# Patient Record
Sex: Female | Born: 1960 | Race: Black or African American | Hispanic: No | Marital: Single | Smoking: Never smoker
Health system: Southern US, Community
[De-identification: ages and names within clinical notes are randomized; demographics above are authoritative.]

## PROBLEM LIST (undated history)

## (undated) DIAGNOSIS — R7301 Impaired fasting glucose: Secondary | ICD-10-CM

## (undated) DIAGNOSIS — I471 Supraventricular tachycardia, unspecified: Secondary | ICD-10-CM

## (undated) DIAGNOSIS — R001 Bradycardia, unspecified: Secondary | ICD-10-CM

## (undated) DIAGNOSIS — E039 Hypothyroidism, unspecified: Secondary | ICD-10-CM

## (undated) DIAGNOSIS — E669 Obesity, unspecified: Secondary | ICD-10-CM

## (undated) DIAGNOSIS — I10 Essential (primary) hypertension: Secondary | ICD-10-CM

## (undated) DIAGNOSIS — E559 Vitamin D deficiency, unspecified: Secondary | ICD-10-CM

## (undated) DIAGNOSIS — I4719 Other supraventricular tachycardia: Secondary | ICD-10-CM

## (undated) DIAGNOSIS — R011 Cardiac murmur, unspecified: Secondary | ICD-10-CM

## (undated) DIAGNOSIS — M79605 Pain in left leg: Secondary | ICD-10-CM

## (undated) DIAGNOSIS — R55 Syncope and collapse: Secondary | ICD-10-CM

## (undated) DIAGNOSIS — R0600 Dyspnea, unspecified: Secondary | ICD-10-CM

## (undated) DIAGNOSIS — M79604 Pain in right leg: Secondary | ICD-10-CM

## (undated) HISTORY — DX: Essential (primary) hypertension: I10

## (undated) HISTORY — DX: Pain in right leg: M79.604

## (undated) HISTORY — DX: Vitamin D deficiency, unspecified: E55.9

## (undated) HISTORY — DX: Cardiac murmur, unspecified: R01.1

## (undated) HISTORY — DX: Obesity, unspecified: E66.9

## (undated) HISTORY — DX: Dyspnea, unspecified: R06.00

## (undated) HISTORY — DX: Supraventricular tachycardia: I47.1

## (undated) HISTORY — DX: Impaired fasting glucose: R73.01

## (undated) HISTORY — DX: Bradycardia, unspecified: R00.1

## (undated) HISTORY — DX: Other supraventricular tachycardia: I47.19

## (undated) HISTORY — DX: Supraventricular tachycardia, unspecified: I47.10

## (undated) HISTORY — DX: Syncope and collapse: R55

## (undated) HISTORY — DX: Hypothyroidism, unspecified: E03.9

## (undated) HISTORY — DX: Pain in right leg: M79.605

---

## 2004-03-01 ENCOUNTER — Ambulatory Visit (HOSPITAL_COMMUNITY): Admission: RE | Admit: 2004-03-01 | Discharge: 2004-03-01 | Payer: Self-pay | Admitting: Endocrinology

## 2005-04-15 ENCOUNTER — Ambulatory Visit: Admission: RE | Admit: 2005-04-15 | Discharge: 2005-04-15 | Payer: Self-pay | Admitting: Endocrinology

## 2007-05-31 ENCOUNTER — Encounter: Admission: RE | Admit: 2007-05-31 | Discharge: 2007-05-31 | Payer: Self-pay | Admitting: Endocrinology

## 2008-05-10 ENCOUNTER — Encounter: Admission: RE | Admit: 2008-05-10 | Discharge: 2008-05-10 | Payer: Self-pay | Admitting: Endocrinology

## 2008-11-08 ENCOUNTER — Encounter: Admission: RE | Admit: 2008-11-08 | Discharge: 2008-11-08 | Payer: Self-pay | Admitting: Endocrinology

## 2008-11-29 ENCOUNTER — Encounter (INDEPENDENT_AMBULATORY_CARE_PROVIDER_SITE_OTHER): Payer: Self-pay | Admitting: Interventional Radiology

## 2008-11-29 ENCOUNTER — Other Ambulatory Visit: Admission: RE | Admit: 2008-11-29 | Discharge: 2008-11-29 | Payer: Self-pay | Admitting: Interventional Radiology

## 2008-11-29 ENCOUNTER — Encounter: Admission: RE | Admit: 2008-11-29 | Discharge: 2008-11-29 | Payer: Self-pay | Admitting: Endocrinology

## 2010-03-04 ENCOUNTER — Encounter
Admission: RE | Admit: 2010-03-04 | Discharge: 2010-03-04 | Payer: Self-pay | Source: Home / Self Care | Attending: Endocrinology | Admitting: Endocrinology

## 2010-08-14 IMAGING — US US SOFT TISSUE HEAD/NECK
1 series · 14 of 25 positions shown · non-contrast
Comparison: Ultrasound of the thyroid of 05/10/2008

CLINICAL DATA: Follow up of thyroid nodule, the patient is on
Synthroid

THYROID ULTRASOUND
TECHNIQUE: Ultrasound examination of the thyroid gland and
adjacent soft tissues was performed.

[Series 1: us soft tissue head/neck · 0.09mm/px · 14 of 42 slices shown]
[im 1/42]
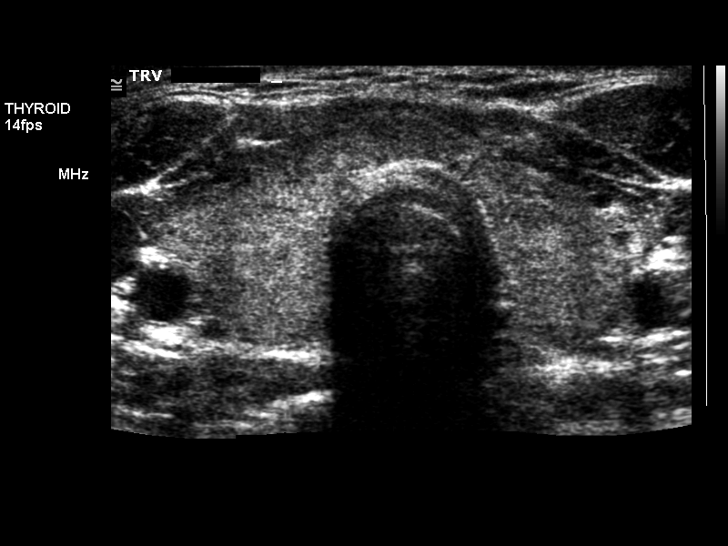
[im 4/42]
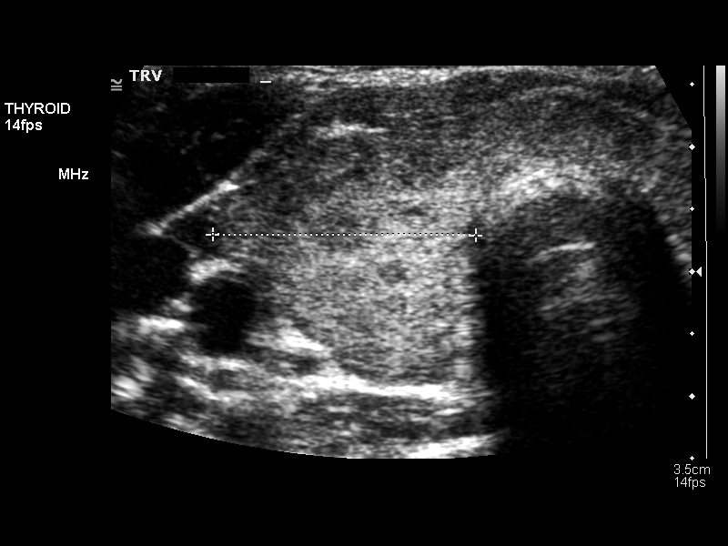
[im 7/42]
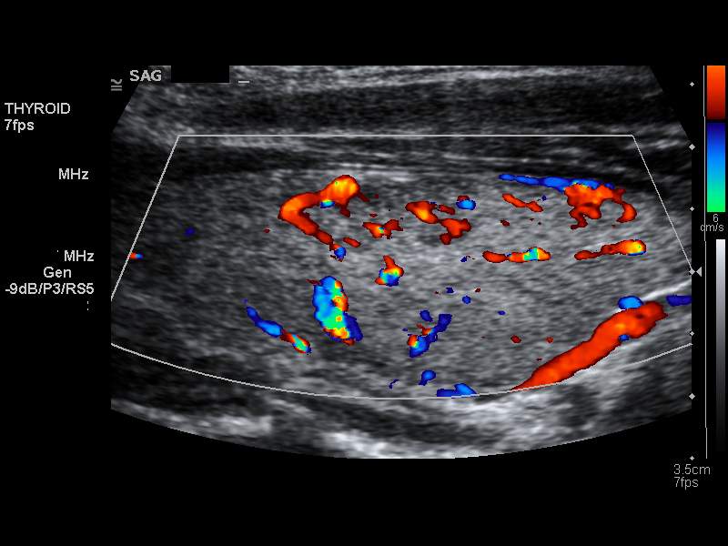
[im 11/42]
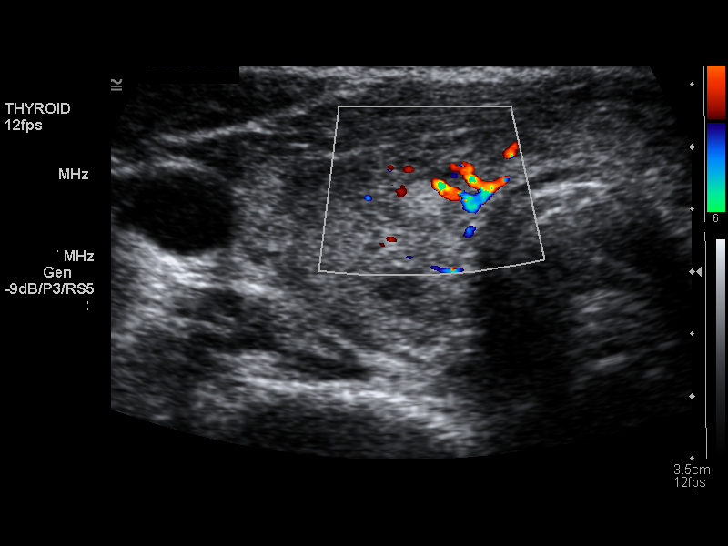
[im 14/42]
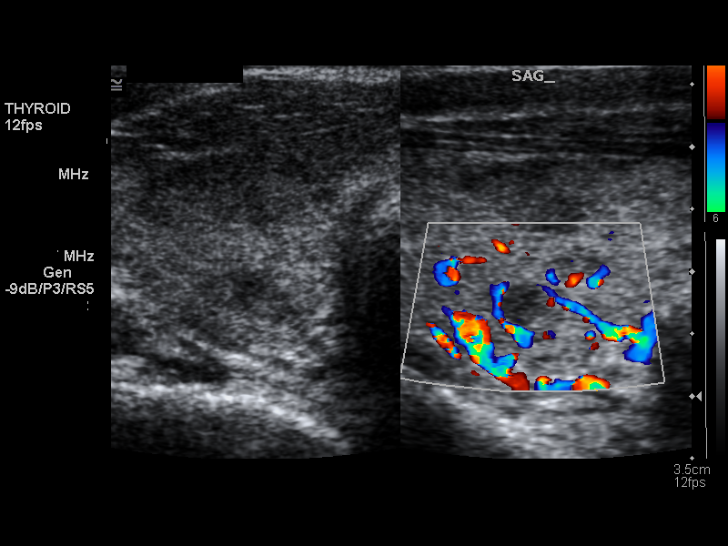
[im 16/42]
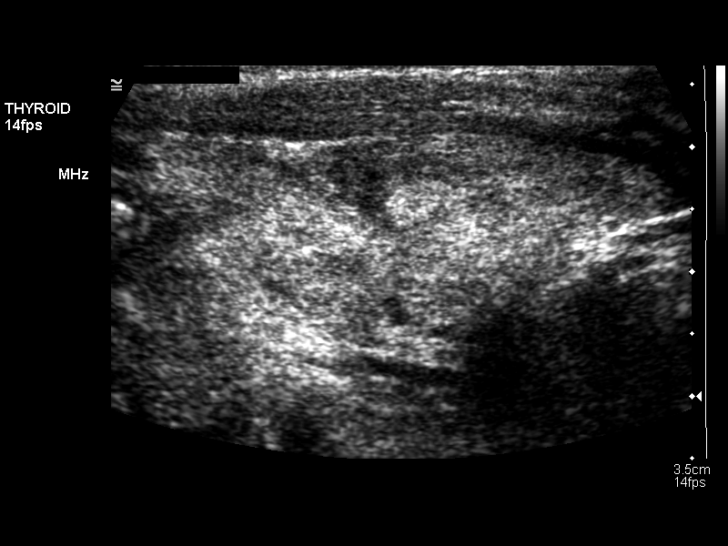
[im 19/42]
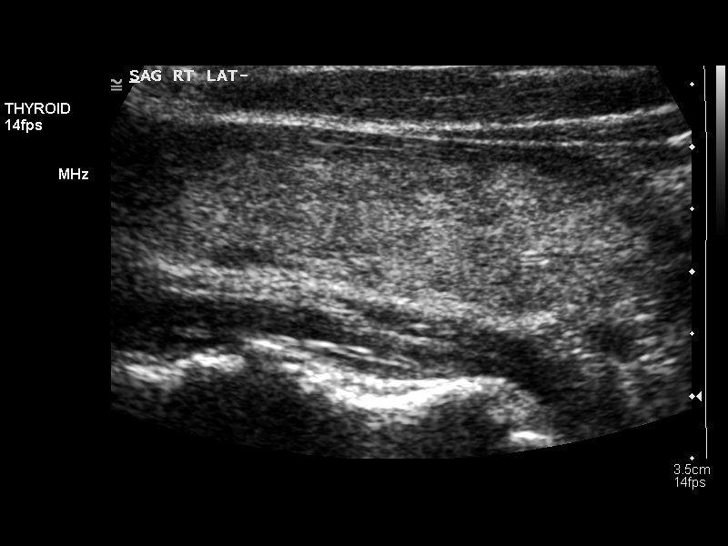
[im 23/42]
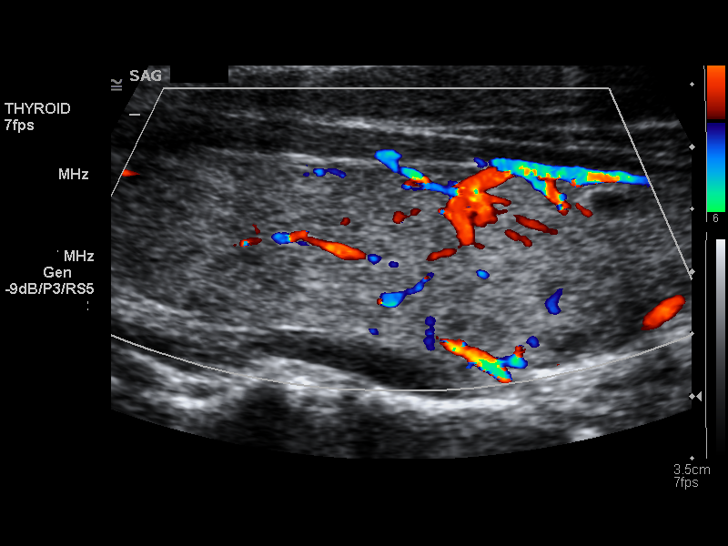
[im 26/42]
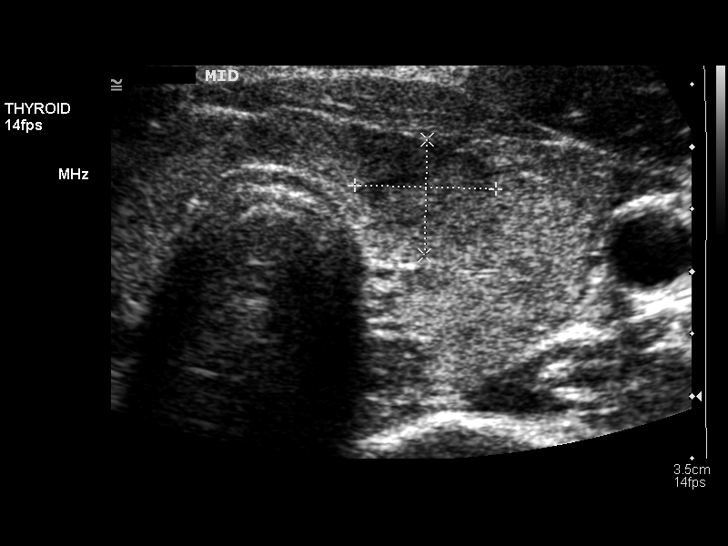
[im 28/42]
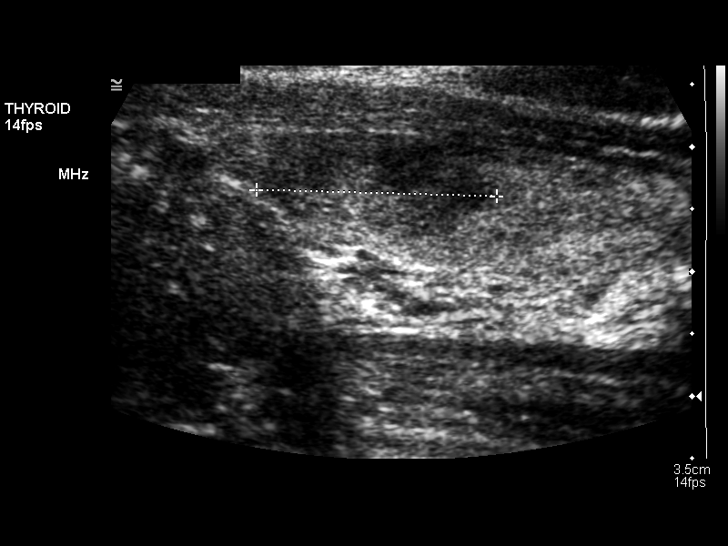
[im 31/42]
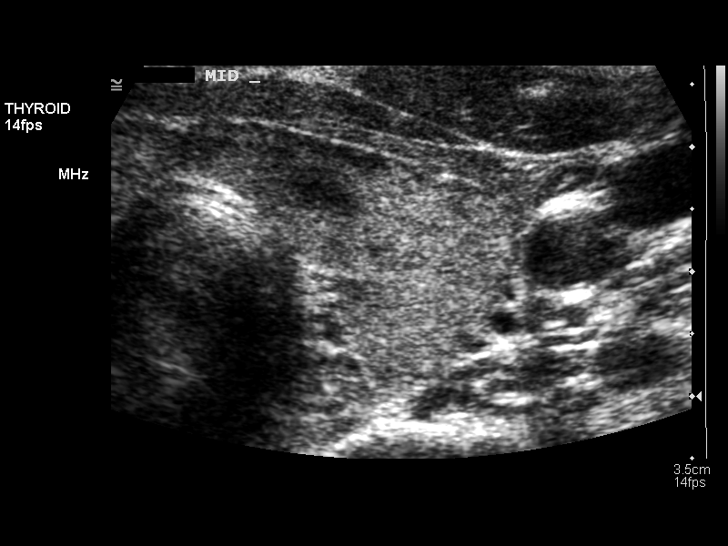
[im 35/42]
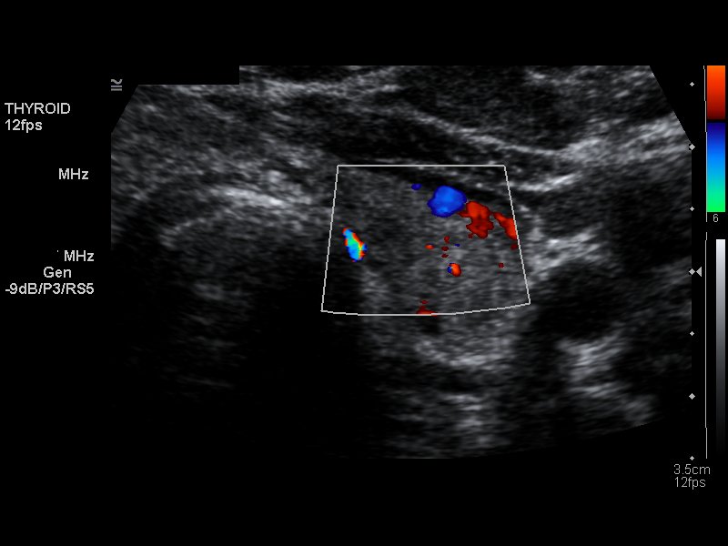
[im 38/42]
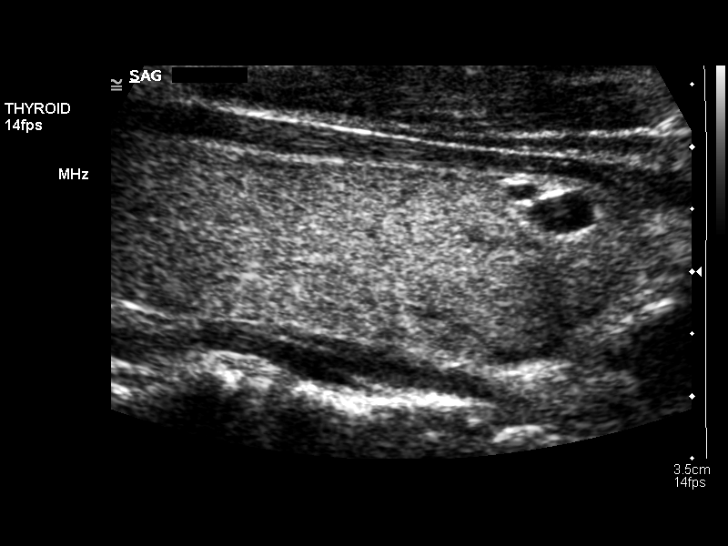
[im 42/42]
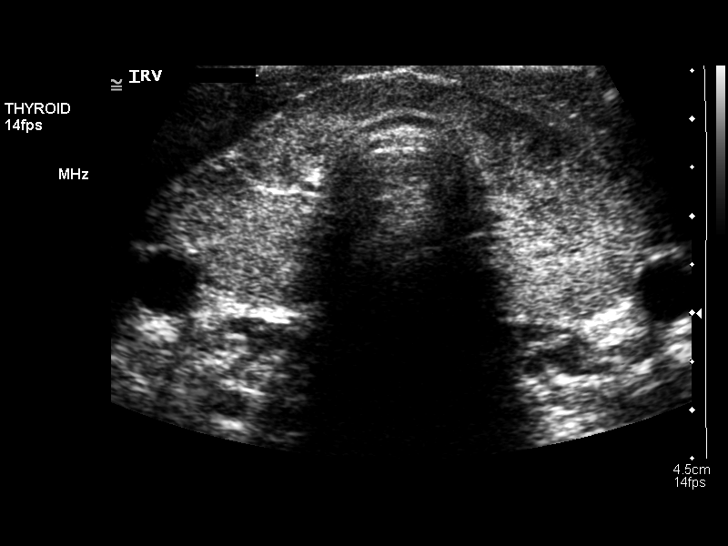

[14 of 25 positions shown; findings below may reference images not displayed]

FINDINGS: The thyroid gland is stable in size.  The right lobe
measures 5.4 x 1.8 x 2.1 cm, compared to prior measurements of
x 1.8 x 2.1 cm.  The left lobe measures 5.4 x 1.7 x 1.9 cm, with
the isthmus measuring 4 mm.  Previously the left lobe measured
x 1.7 x 1.9 cm with the isthmus measuring 5 mm.

The gland is diffusely inhomogeneous.  There are nodules which are
poorly defined bilaterally.  The largest nodule is in the mid
medial left lobe near the isthmus of 1.1 x 0.9 x 1.9 cm, and has
increased slightly in size.  Previous measurements were 1.3 x 0.4 x
1.1 cm.  A hypoechoic nodule is present in the lower lobe on the
left of no more than 6 mm in diameter.

On the right two nodules are present neither of which measures more
than 6 mm in diameter.
IMPRESSION: 1.  Stable ultrasound of the thyroid with only minimal increase in
size of the more prominent nodule near the isthmus on the left of
1.1 x 0.9 x 1.9 cm.

## 2011-04-21 ENCOUNTER — Other Ambulatory Visit: Payer: Self-pay | Admitting: Endocrinology

## 2011-04-21 DIAGNOSIS — E041 Nontoxic single thyroid nodule: Secondary | ICD-10-CM

## 2011-10-15 ENCOUNTER — Ambulatory Visit
Admission: RE | Admit: 2011-10-15 | Discharge: 2011-10-15 | Disposition: A | Discharge status: Home / Self Care | Payer: 59 | Source: Ambulatory Visit | Attending: Endocrinology | Admitting: Endocrinology

## 2011-10-15 DIAGNOSIS — E041 Nontoxic single thyroid nodule: Secondary | ICD-10-CM

## 2011-10-20 ENCOUNTER — Other Ambulatory Visit: Payer: Self-pay

## 2011-12-08 IMAGING — US US SOFT TISSUE HEAD/NECK
1 series · 14 of 25 positions shown · non-contrast
Comparison: Ultrasound of the thyroid of 11/08/2008

CLINICAL DATA: Follow up of thyroid nodules, the patient does take
Synthroid

THYROID ULTRASOUND
TECHNIQUE: Ultrasound examination of the thyroid gland and adjacent
soft tissues was performed.

[Series 1: us soft tissue head/neck · 0.08mm/px · 14 of 46 slices shown]
[im 1/46]
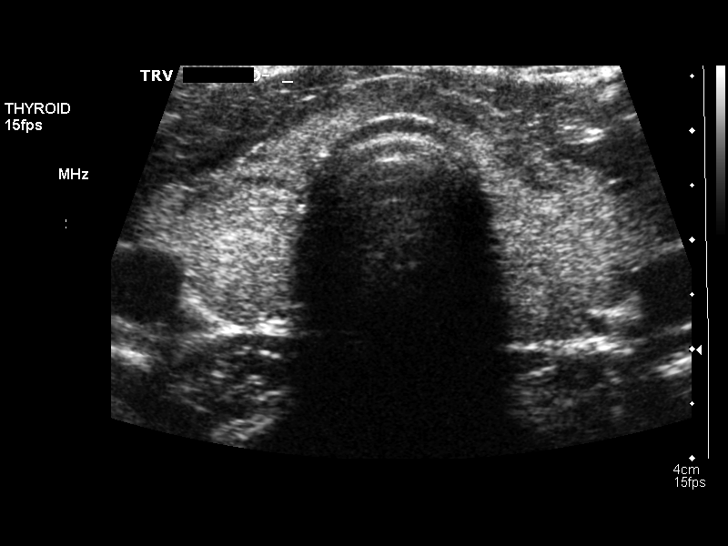
[im 4/46]
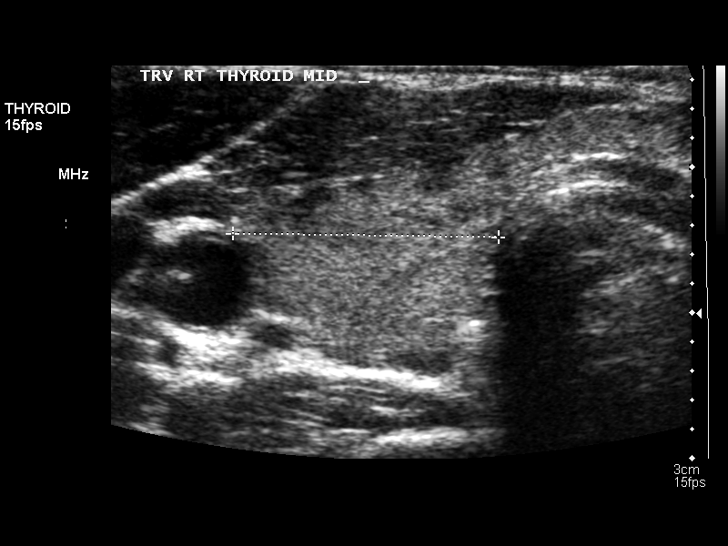
[im 8/46]
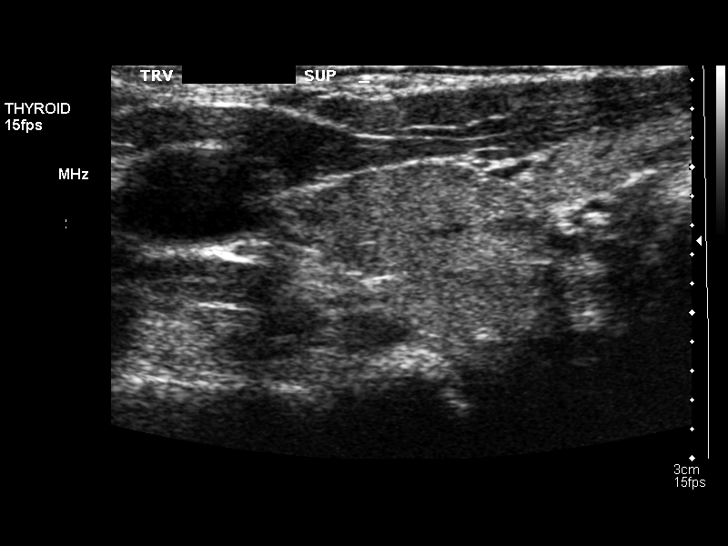
[im 12/46]
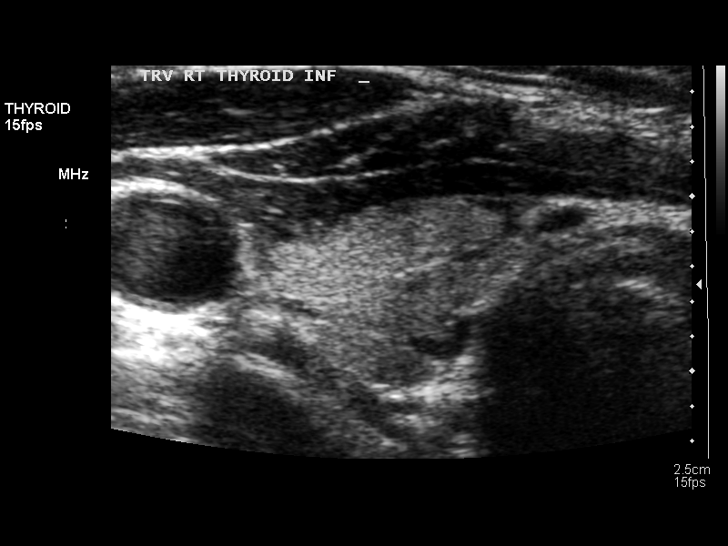
[im 16/46]
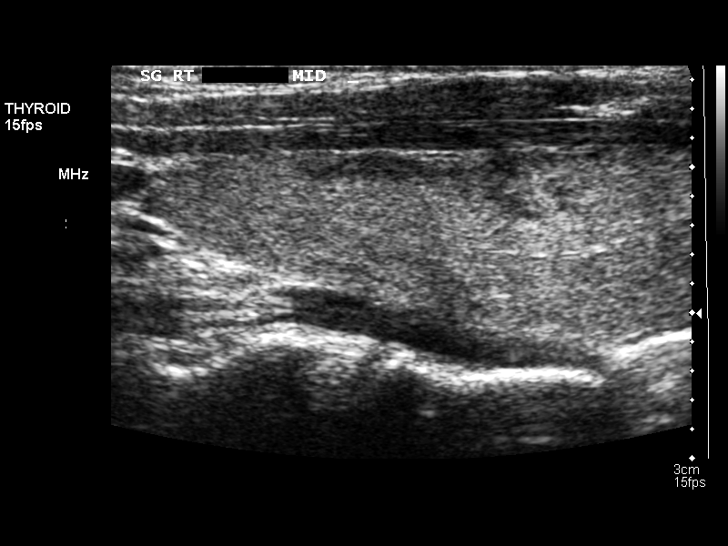
[im 17/46]
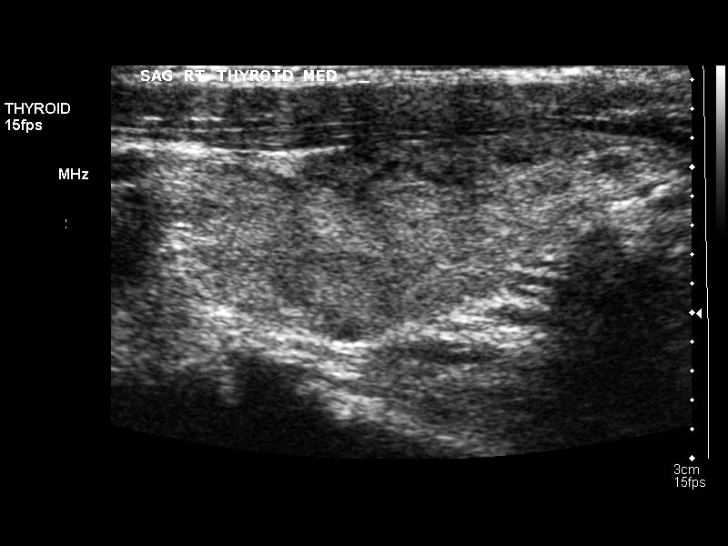
[im 21/46]
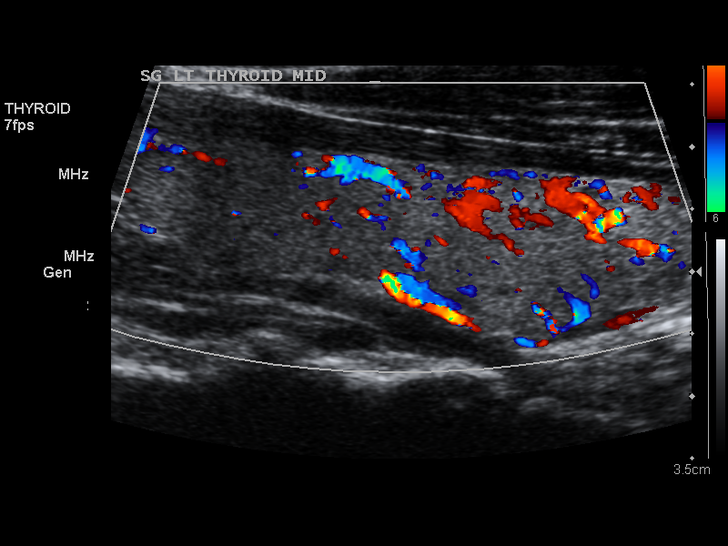
[im 25/46]
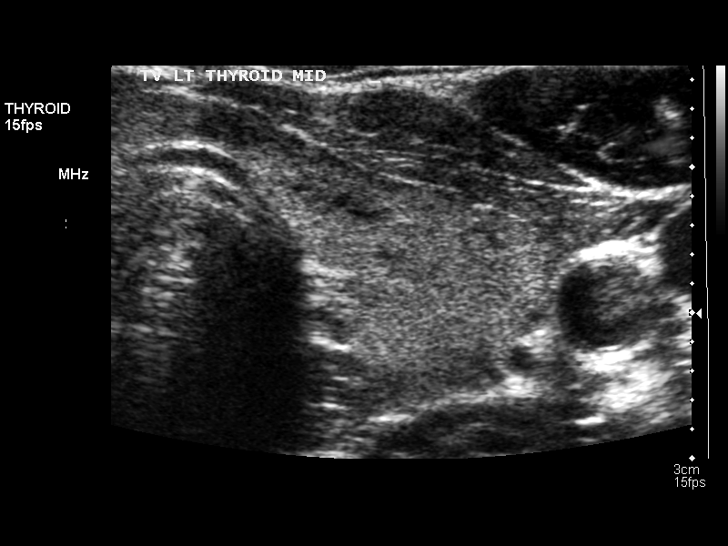
[im 29/46]
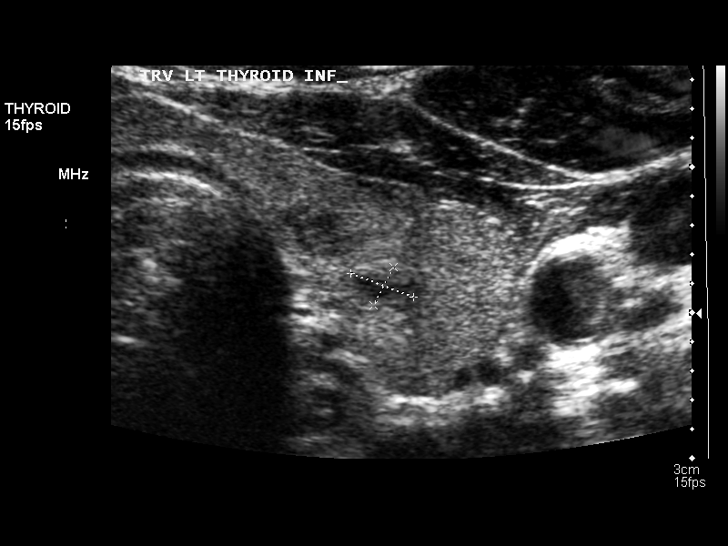
[im 31/46]
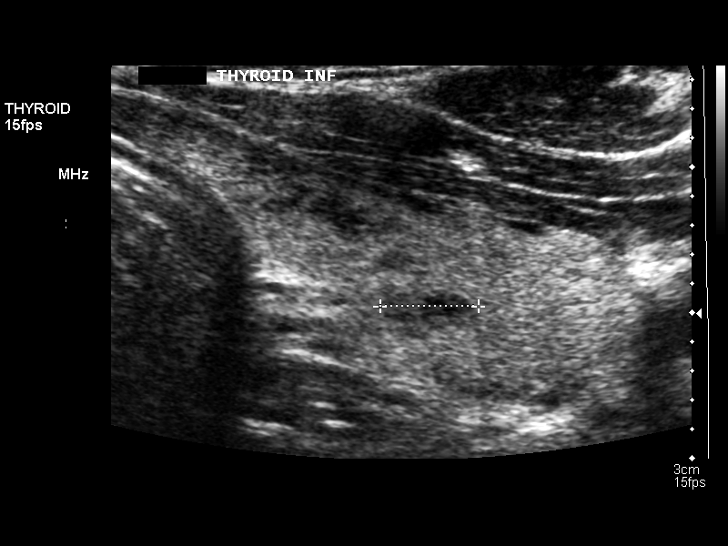
[im 34/46]
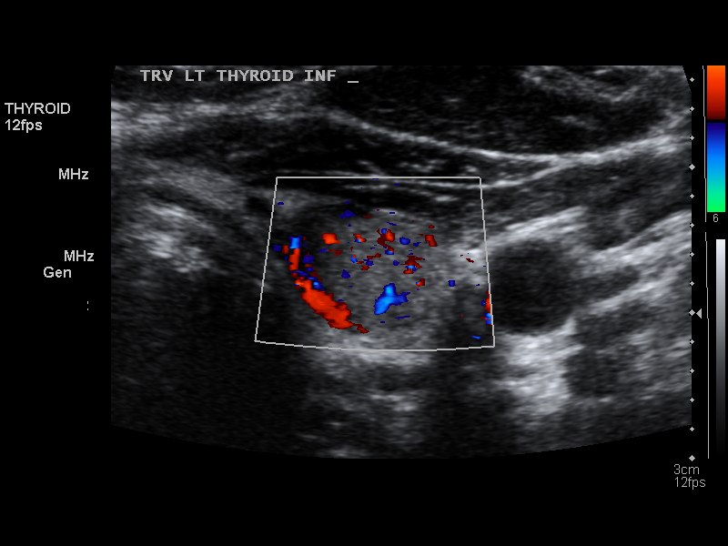
[im 38/46]
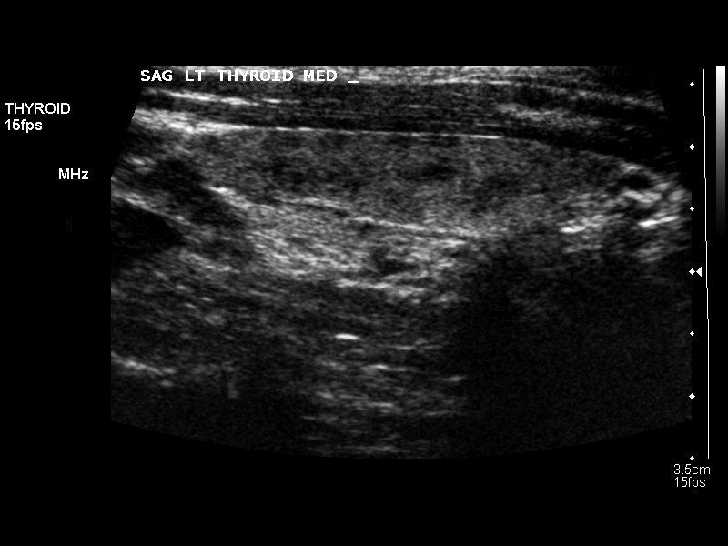
[im 42/46]
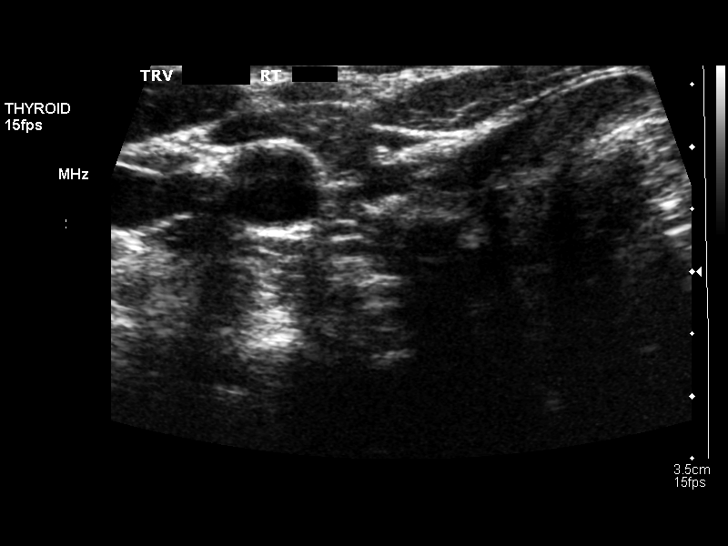
[im 46/46]
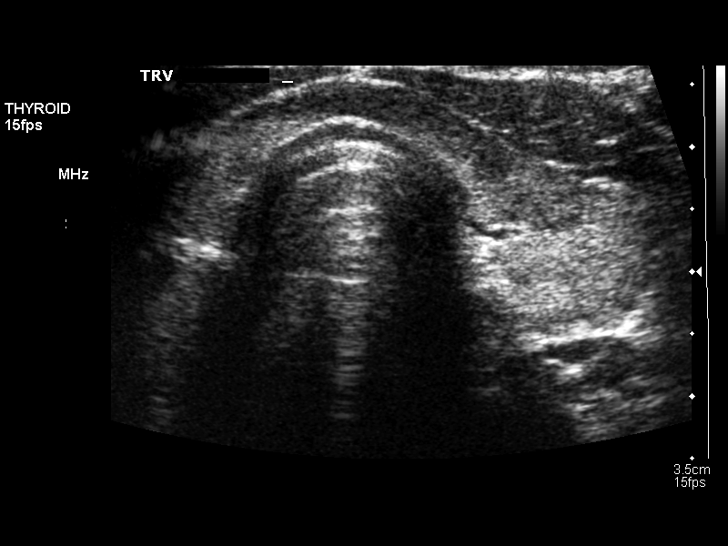

[14 of 25 positions shown; findings below may reference images not displayed]

FINDINGS: Right thyroid lobe:  4.9 x 1.7 x 1.8 cm.  (Previously 5.4 x 1.8 x
2.1 cm).
Left thyroid lobe:  5.2 x 1.5 x 1.7 cm.  (Previously 5.4 x 1.7 x
1.9 cm).
Isthmus:  4 mm which is stable.

Focal nodules:  The thyroid gland is inhomogeneous in echogenicity.
Only a single small complex nodule is noted in the left lower pole
measuring 5 x 3 x 7 mm.  No new or enlarging nodule is seen. No
other definite measurable nodule is identified.

Lymphadenopathy:  None
IMPRESSION: Inhomogeneous thyroid with only a single small complex nodule in
the left lower pole.  No new or enlarging nodule is seen.

## 2013-04-22 ENCOUNTER — Other Ambulatory Visit: Payer: Self-pay | Admitting: Endocrinology

## 2013-04-22 DIAGNOSIS — E041 Nontoxic single thyroid nodule: Secondary | ICD-10-CM

## 2013-07-20 IMAGING — US US SOFT TISSUE HEAD/NECK
1 series · 14 of 25 positions shown · non-contrast
Comparison: 03/04/2010 and earlier studies

CLINICAL DATA: Thyroid nodule.  Previous FNA biopsy of the dominant
left//isthmic lesion 11/29/2008.

THYROID ULTRASOUND
TECHNIQUE: Ultrasound examination of the thyroid gland and adjacent
soft tissues was performed.

[Series 1: us soft tissue head/neck · 0.08mm/px · 14 of 42 slices shown]
[im 1/42]
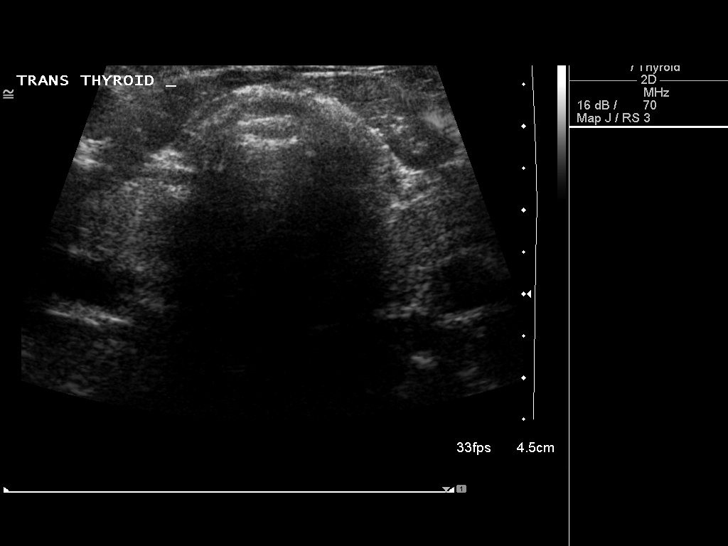
[im 4/42]
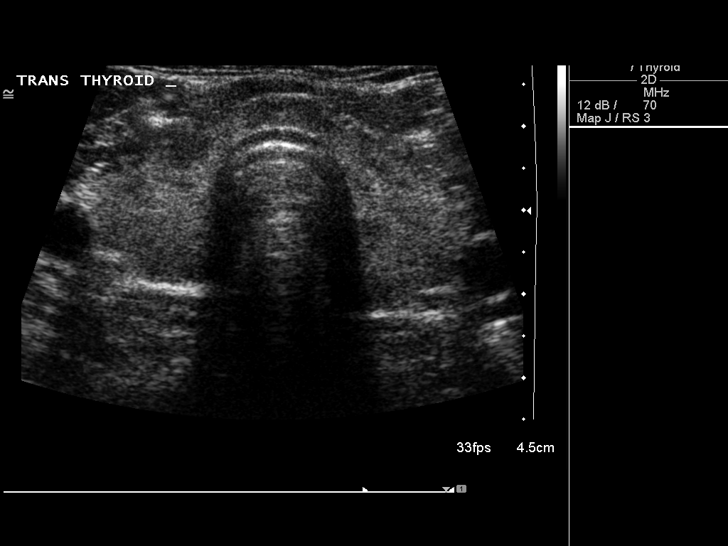
[im 7/42]
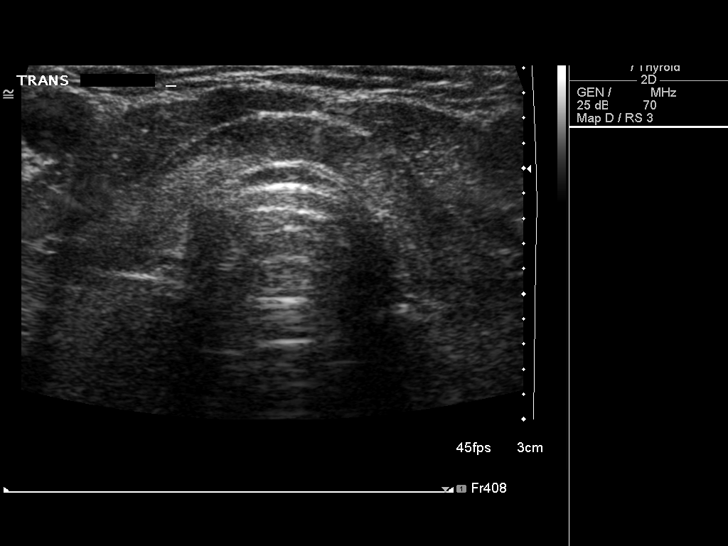
[im 11/42]
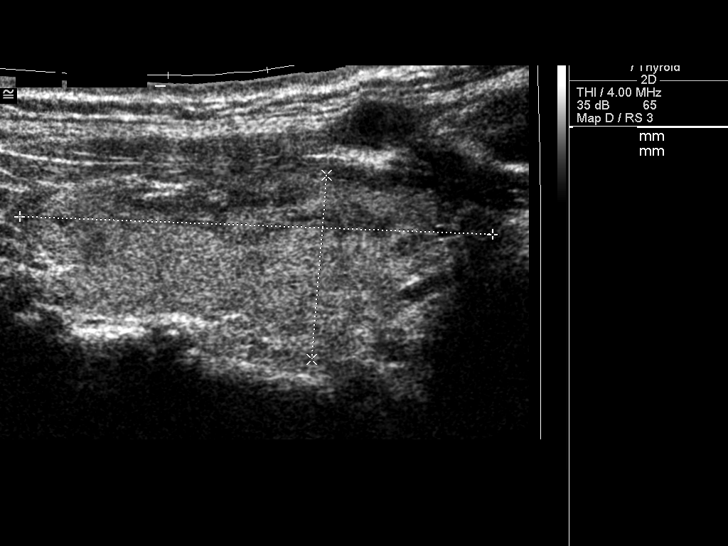
[im 14/42]
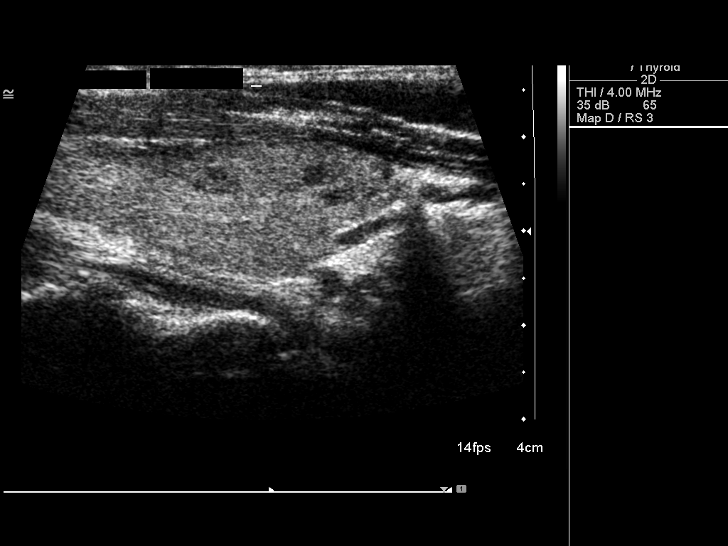
[im 16/42]
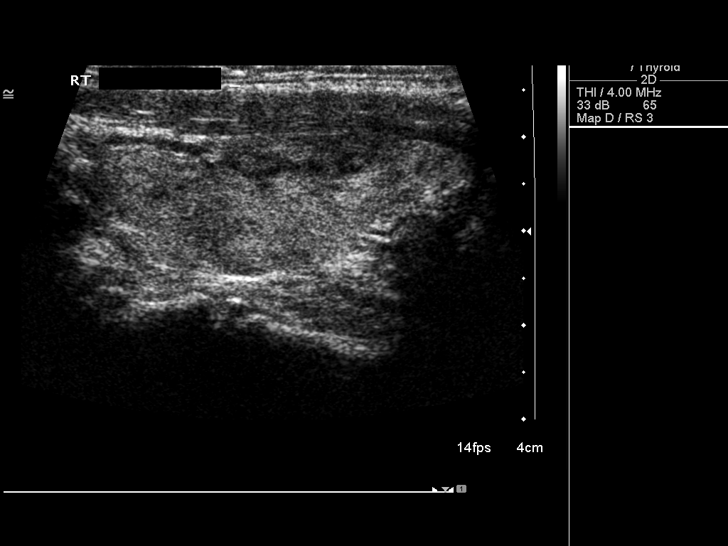
[im 19/42]
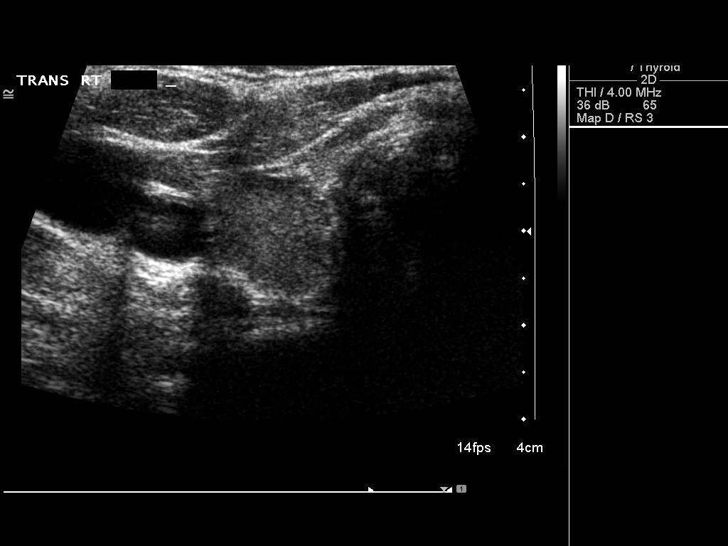
[im 23/42]
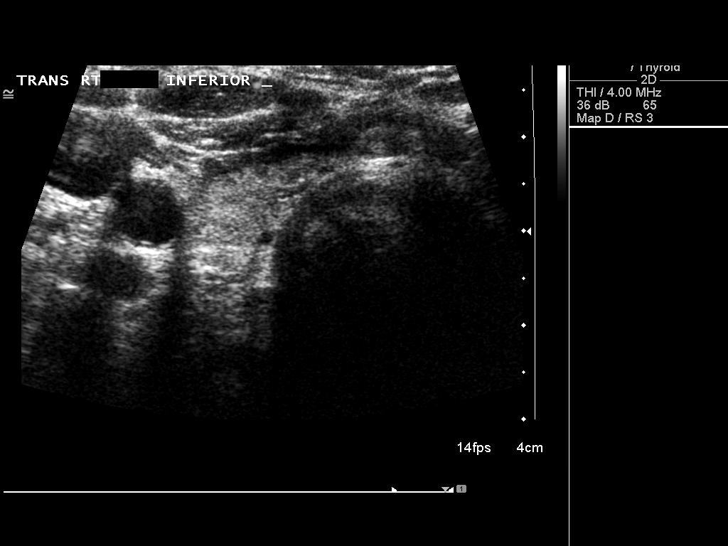
[im 26/42]
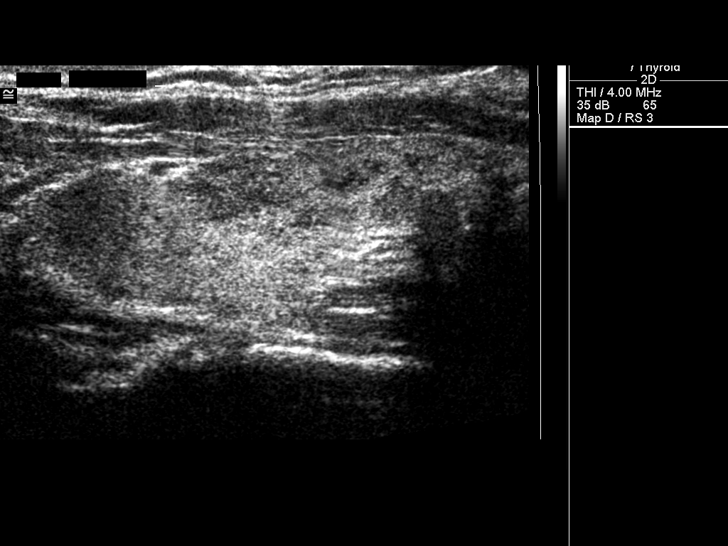
[im 28/42]
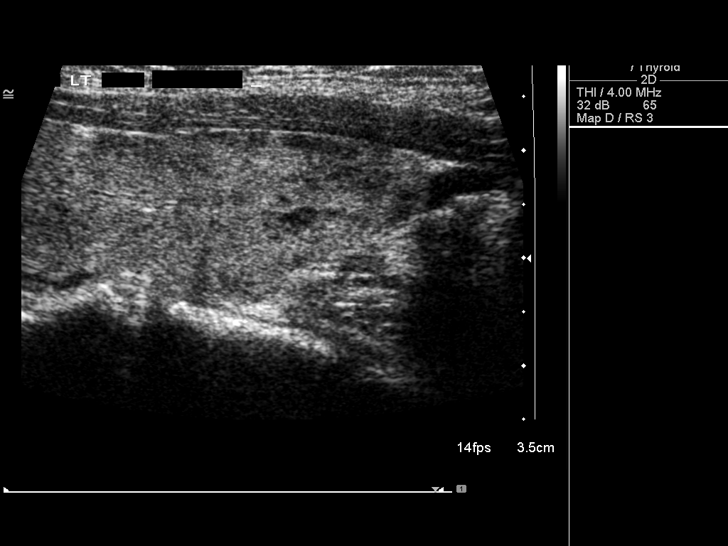
[im 31/42]
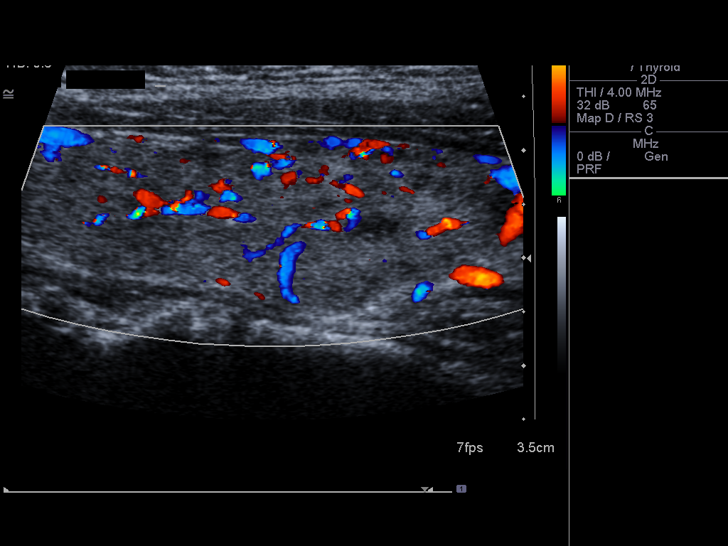
[im 35/42]
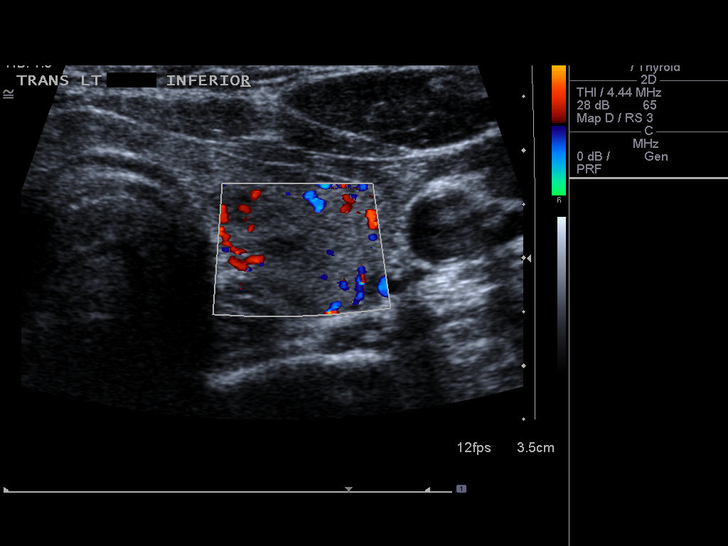
[im 38/42]
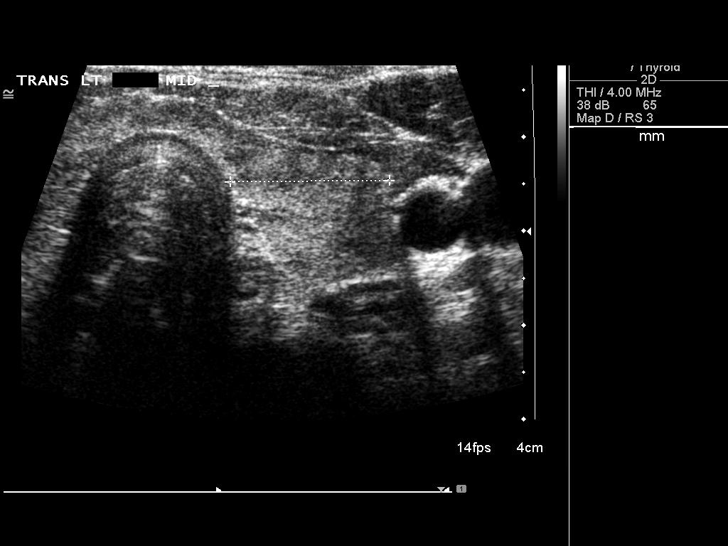
[im 42/42]
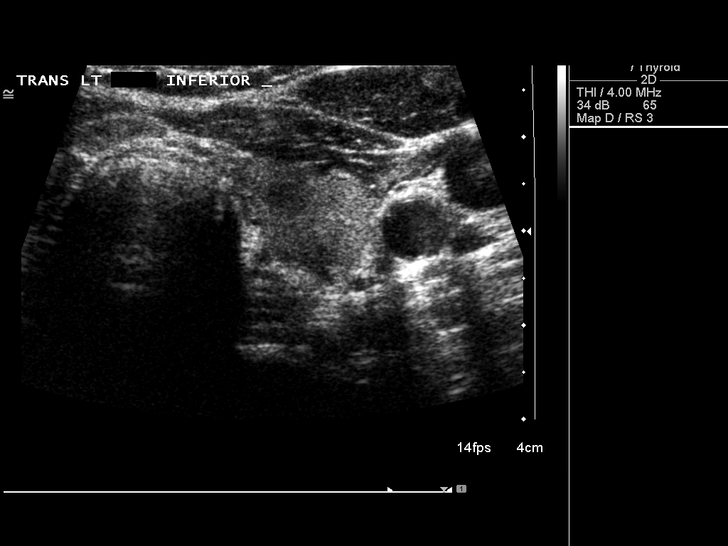

[14 of 25 positions shown; findings below may reference images not displayed]

FINDINGS: Right thyroid lobe:  16 x 19 x 48 mm, inhomogeneous
Left thyroid lobe:  17 x 19 x 49 mm
Isthmus:  4 mm in thickness

Focal nodules:  3 x 4 mm hypoechoic solid, inferior left
(previously 3 x 5 x 7)

Lymphadenopathy:  None visualized.
IMPRESSION: Interval decrease in single small discrete left thyroid nodule.
Findings do not meet current SRU consensus criteria for biopsy.
Follow-up by clinical exam is recommended.  If patient has known
risk factors for thyroid carcinoma, consider follow-up ultrasound
in 12 months.  If patient is clinically hyperthyroid, consider
nuclear medicine thyroid uptake and scan.

## 2013-10-14 ENCOUNTER — Other Ambulatory Visit: Payer: 59

## 2019-04-17 ENCOUNTER — Ambulatory Visit: Payer: Self-pay | Attending: Internal Medicine

## 2019-04-17 DIAGNOSIS — Z23 Encounter for immunization: Secondary | ICD-10-CM | POA: Insufficient documentation

## 2019-04-17 NOTE — Progress Notes (Signed)
   Covid-19 Vaccination Clinic  Name:  Natalie Bell    MRN: [id number] DOB: 1960/09/06  04/17/2019  [name] was observed post Covid-19 immunization for 15 minutes without incident. She was provided with Vaccine Information Sheet and instruction to access the V-Safe system.   [name] was instructed to call 911 with any severe reactions post vaccine: Marland Kitchen Difficulty breathing  . Swelling of face and throat  . A fast heartbeat  . A bad rash all over body  . Dizziness and weakness   Immunizations Administered    Name Date Dose VIS Date Route   Pfizer COVID-19 Vaccine 04/17/2019  4:30 PM 0.3 mL 01/21/2019 Intramuscular   Manufacturer: [employer], [employer]   Lot: [id number]   NDC: [address]-9

## 2019-05-18 ENCOUNTER — Ambulatory Visit: Payer: No Typology Code available for payment source | Attending: Internal Medicine

## 2019-05-18 DIAGNOSIS — Z23 Encounter for immunization: Secondary | ICD-10-CM

## 2019-05-18 NOTE — Progress Notes (Signed)
   Covid-19 Vaccination Clinic  Name:  LAMESHA TIBBITS    MRN: [id number] DOB: 07-22-1960  05/18/2019  Ms. Campton was observed post Covid-19 immunization for 15 minutes without incident. She was provided with Vaccine Information Sheet and instruction to access the V-Safe system.   Ms. Urey was instructed to call 911 with any severe reactions post vaccine: Marland Kitchen Difficulty breathing  . Swelling of face and throat  . A fast heartbeat  . A bad rash all over body  . Dizziness and weakness   Immunizations Administered    Name Date Dose VIS Date Route   Pfizer COVID-19 Vaccine 05/18/2019  4:19 PM 0.3 mL 01/21/2019 Intramuscular   Manufacturer: [employer], [employer]   Lot: [id number]   NDC: [address]-3

## 2021-03-08 ENCOUNTER — Encounter: Payer: Self-pay | Admitting: Cardiology

## 2021-03-08 ENCOUNTER — Other Ambulatory Visit: Payer: Self-pay

## 2021-03-08 ENCOUNTER — Ambulatory Visit (INDEPENDENT_AMBULATORY_CARE_PROVIDER_SITE_OTHER): Payer: No Typology Code available for payment source | Admitting: Cardiology

## 2021-03-08 ENCOUNTER — Ambulatory Visit (INDEPENDENT_AMBULATORY_CARE_PROVIDER_SITE_OTHER): Payer: No Typology Code available for payment source

## 2021-03-08 VITALS — BP 124/70 | HR 73 | Ht 69.0 in | Wt 235.8 lb

## 2021-03-08 DIAGNOSIS — I471 Supraventricular tachycardia, unspecified: Secondary | ICD-10-CM

## 2021-03-08 DIAGNOSIS — R0600 Dyspnea, unspecified: Secondary | ICD-10-CM | POA: Diagnosis not present

## 2021-03-08 DIAGNOSIS — I4719 Other supraventricular tachycardia: Secondary | ICD-10-CM

## 2021-03-08 NOTE — Patient Instructions (Addendum)
Medication Instructions:  Your physician recommends that you continue on your current medications as directed. Please refer to the Current Medication list given to you today. *If you need a refill on your cardiac medications before your next appointment, please call your pharmacy*  Lab Work: None. If you have labs (blood work) drawn today and your tests are completely normal, you will receive your results only by: MyChart Message (if you have MyChart) OR A paper copy in the mail If you have any lab test that is abnormal or we need to change your treatment, we will call you to review the results.  Testing/Procedures: Your physician has recommended that you wear a Heart monitor. Heart monitors are medical devices that record the hearts electrical activity. Doctors most often use these monitors to diagnose arrhythmias. Arrhythmias are problems with the speed or rhythm of the heartbeat. The monitor is a small, portable device. You can wear one while you do your normal daily activities. This is usually used to diagnose what is causing palpitations/syncope (passing out).   Follow-Up: At [facility], you and your health needs are our priority.  As part of our continuing mission to provide you with exceptional heart care, we have created designated Provider Care Teams.  These Care Teams include your primary Cardiologist (physician) and Advanced Practice Providers (APPs -  Physician Assistants and Nurse Practitioners) who all work together to provide you with the care you need, when you need it.  Your physician wants you to follow-up in: 6-8 weeks Virtual with Natalie Dunn, MD  We recommend signing up for the patient portal called "MyChart".  Sign up information is provided on this After Visit Summary.  MyChart is used to connect with patients for Virtual Visits (Telemedicine).  Patients are able to view lab/test results, encounter notes, upcoming appointments, etc.  Non-urgent messages can be sent  to your provider as well.   To learn more about what you can do with MyChart, go to [email or web address].    Any Other Special Instructions Will Be Listed Below (If Applicable).  ZIO XT- Long Term Monitor Instructions  Your physician has requested you wear a ZIO patch monitor for 14 days.  This is a single patch monitor. [employer] supplies one patch monitor per enrollment. Additional stickers are not available. Please do not apply patch if you will be having a Nuclear Stress Test,  Echocardiogram, Cardiac CT, MRI, or Chest Xray during the period you would be wearing the  monitor. The patch cannot be worn during these tests. You cannot remove and re-apply the  ZIO XT patch monitor.  Your ZIO patch monitor will be mailed 3 day USPS to your address on file. It may take 3-5 days  to receive your monitor after you have been enrolled.  Once you have received your monitor, please review the enclosed instructions. Your monitor  has already been registered assigning a specific monitor serial # to you.  Billing and Patient Assistance Program Information  We have supplied [employer] with any of your insurance information on file for billing purposes. [employer] offers a sliding scale Patient Assistance Program for patients that do not have  insurance, or whose insurance does not completely cover the cost of the ZIO monitor.  You must apply for the Patient Assistance Program to qualify for this discounted rate.  To apply, please call [employer] at [phone], select option 4, select option 2, ask to apply for  Patient Assistance Program. Natalie Bell will ask your household income, and how many people  are in your household. They will quote your out-of-pocket cost based on that information.  [employer] will also be able to set up a 60-month, interest-free payment plan if needed.  Applying the monitor   Shave hair from upper left chest.  Hold abrader disc by orange tab. Rub abrader in 40 strokes over the upper  left chest as  indicated in your monitor instructions.  Clean area with 4 enclosed alcohol pads. Let dry.  Apply patch as indicated in monitor instructions. Patch will be placed under collarbone on left  side of chest with arrow pointing upward.  Rub patch adhesive wings for 2 minutes. Remove white label marked "1". Remove the white  label marked "2". Rub patch adhesive wings for 2 additional minutes.  While looking in a mirror, press and release button in center of patch. A small green light will  flash 3-4 times. This will be your only indicator that the monitor has been turned on.  Do not shower for the first 24 hours. You may shower after the first 24 hours.  Press the button if you feel a symptom. You will hear a small click. Record Date, Time and  Symptom in the Patient Logbook.  When you are ready to remove the patch, follow instructions on the last 2 pages of Patient  Logbook. Stick patch monitor onto the last page of Patient Logbook.  Place Patient Logbook in the blue and white box. Use locking tab on box and tape box closed  securely. The blue and white box has prepaid postage on it. Please place it in the mailbox as  soon as possible. Your physician should have your test results approximately 7 days after the  monitor has been mailed back to [facility].  Call [facility] Customer Care at [phone] if you have questions regarding  your ZIO XT patch monitor. Call them immediately if you see an orange light blinking on your  monitor.  If your monitor falls off in less than 4 days, contact our Monitor department at [phone].  If your monitor becomes loose or falls off after 4 days call [employer] at [phone] for  suggestions on securing your monitor

## 2021-03-08 NOTE — Progress Notes (Signed)
Electrophysiology Office Note:    Date:  03/08/2021   ID:  Natalie Bell, DOB 1960-08-25, MRN [id number]  PCP:  Lynnea Ferrier., MD  [facility] Cardiologist:  None  [facility] Electrophysiologist:  None   Referring MD: Katherine Basset, PA-C   Chief Complaint: History of atrial tachycardia, palpitations, dyspnea  History of Present Illness:    Natalie Bell is a 61 y.o. female who presents for an evaluation of history of atrial tachycardia at the request of Tomasa Hose, PA-C. Their medical history includes hypothyroidism, hypertension and atrial tachycardia.  The patient was previously seen at [facility] for the same.  She tells me that in the past she was told that she may need an EP study and ablation.  Another time she was told she may need a pacemaker.  She presents today for second opinion.  I have reviewed records from [facility].  A previous ZIO report showed many episodes of a likely atrial tachycardia.  It looks like an EP study with possible ablation was initially recommended but this was planned after a stress test was to be performed.  Stress test was ultimately performed which showed no evidence of ischemic heart disease.  Seems like the patient may have experienced some bradycardic episodes while taking calcium channel blockers and so at some point a pacemaker was briefly discussed with the patient.  The patient tells me that she exercises aerobically and does not have any chest pain or significant dyspnea when she exercises.   Past Medical History:  Diagnosis Date   Dyspnea    Heart murmur    HTN (hypertension)    Hypothyroidism (acquired)    IFG (impaired fasting glucose)    Obesity    Pain in both lower extremities    Paroxysmal atrial tachycardia (HCC)    Pre-syncope    Sinus bradycardia    SVT (supraventricular tachycardia) (HCC)    Vitamin D deficiency     History reviewed. No pertinent surgical history.  Current  Medications: Current Meds  Medication Sig   Ascorbic Acid (VITAMIN C) 100 MG tablet Take 100 mg by mouth daily.   diltiazem (CARDIZEM) 120 MG tablet Take 120 mg by mouth 4 (four) times daily.   ergocalciferol (VITAMIN D2) 1.25 MG (50000 UT) capsule Take 50,000 Units by mouth once a week.   levothyroxine (SYNTHROID) 88 MCG tablet Take 88 mcg by mouth daily before breakfast.   Magnesium Oxide 400 MG CAPS Take by mouth daily.   Misc Natural Products (AIRBORNE ELDERBERRY PO) Take 100 mg by mouth daily.   potassium bicarbonate (K-LYTE) 25 MEQ disintegrating tablet Take 25 mEq by mouth 2 (two) times daily.   valsartan (DIOVAN) 80 MG tablet Take 80 mg by mouth daily.     Allergies:   Patient has no known allergies.   Social History   Socioeconomic History   Marital status: Single    Spouse name: Not on file   Number of children: Not on file   Years of education: Not on file   Highest education level: Not on file  Occupational History   Not on file  Tobacco Use   Smoking status: Never   Smokeless tobacco: Never   Tobacco comments:    Never smoked  Substance and Sexual Activity   Alcohol use: Not Currently   Drug use: Never   Sexual activity: Not Currently    Birth control/protection: None    Comment: Hysterectomy 2004  Other Topics Concern   Not  on file  Social History Narrative   Not on file   Social Determinants of Health   Financial Resource Strain: Not on file  Food Insecurity: Not on file  Transportation Needs: Not on file  Physical Activity: Not on file  Stress: Not on file  Social Connections: Not on file     Family History: The patient's family history includes Asthma in her maternal grandmother; Hypertension in her maternal grandmother and mother.  ROS:   Please see the history of present illness.    All other systems reviewed and are negative.  EKGs/Labs/Other Studies Reviewed:    The following studies were reviewed today:  [facility] records  EKG:   The ekg ordered today demonstrates sinus rhythm.  No preexcitation.  Intervals are normal.   Recent Labs: No results found for requested labs within last 8760 hours.  Recent Lipid Panel No results found for: CHOL, TRIG, HDL, CHOLHDL, VLDL, LDLCALC, LDLDIRECT  Physical Exam:    VS:  BP 124/70    Pulse 73    Ht 5\' 9"  (1.753 m)    Wt 235 lb 12.8 oz (107 kg)    LMP  (LMP Unknown)    SpO2 97%    BMI 34.82 kg/m     Wt Readings from Last 3 Encounters:  03/08/21 235 lb 12.8 oz (107 kg)     GEN:  Well nourished, well developed in no acute distress HEENT: Normal NECK: No JVD; No carotid bruits LYMPHATICS: No lymphadenopathy CARDIAC: RRR, no murmurs, rubs, gallops RESPIRATORY:  Clear to auscultation without rales, wheezing or rhonchi  ABDOMEN: Soft, non-tender, non-distended MUSCULOSKELETAL:  No edema; No deformity  SKIN: Warm and dry NEUROLOGIC:  Alert and oriented x 3 PSYCHIATRIC:  Normal affect       ASSESSMENT:    1. SVT (supraventricular tachycardia) (HCC)    PLAN:    In order of problems listed above:   #SVT #Atrial tachycardia history Patient reportedly has a history of atrial tachycardia but I do not have any actual evidence of this.  I will plan to get a 2-week ZIO monitor to assess for the burden of arrhythmia.  We discussed what would be the next step if the ZIO monitor was unrevealing.  We discussed using a loop recorder for longer-term monitoring of her heart rhythm.  I do not think an EP study and ablation is currently indicated given the seemingly very low burden of any arrhythmia.  I do think it is reasonable to pursue a loop recorder implant given her history of arrhythmia/palpitations if the initial 0 report is negative.  We will follow-up in clinic after the ZIO has resulted and schedule loop recorder implant if it is negative.  #Dyspnea She has work-up ongoing with a pulmonologist which I think is very reasonable.   Follow-up in 6 to 8 weeks with me via  virtual visit.    Total time spent with patient today 45 minutes. This includes reviewing records, evaluating the patient and coordinating care.  Medication Adjustments/Labs and Tests Ordered: Current medicines are reviewed at length with the patient today.  Concerns regarding medicines are outlined above.  Orders Placed This Encounter  Procedures   LONG TERM MONITOR (3-14 DAYS)   EKG 12-Lead   No orders of the defined types were placed in this encounter.    Signed, Hilton Cork. Quentin Ore, MD, [facility], [facility] 03/08/2021 9:18 PM    Electrophysiology [facility]

## 2021-03-08 NOTE — Progress Notes (Unsigned)
Enrolled patient for a 14 day Zio XT  monitor to be mailed to patients home  °

## 2021-03-13 DIAGNOSIS — I471 Supraventricular tachycardia: Secondary | ICD-10-CM

## 2021-03-20 ENCOUNTER — Encounter (HOSPITAL_BASED_OUTPATIENT_CLINIC_OR_DEPARTMENT_OTHER): Payer: Self-pay

## 2021-03-20 ENCOUNTER — Other Ambulatory Visit: Payer: Self-pay

## 2021-03-20 ENCOUNTER — Emergency Department (HOSPITAL_BASED_OUTPATIENT_CLINIC_OR_DEPARTMENT_OTHER)
Admission: EM | Admit: 2021-03-20 | Discharge: 2021-03-20 | Disposition: A | Discharge status: Home / Self Care | Payer: No Typology Code available for payment source | Attending: Emergency Medicine | Admitting: Emergency Medicine

## 2021-03-20 DIAGNOSIS — B349 Viral infection, unspecified: Secondary | ICD-10-CM

## 2021-03-20 DIAGNOSIS — Z20822 Contact with and (suspected) exposure to covid-19: Secondary | ICD-10-CM | POA: Insufficient documentation

## 2021-03-20 DIAGNOSIS — R519 Headache, unspecified: Secondary | ICD-10-CM | POA: Diagnosis present

## 2021-03-20 LAB — URINALYSIS, ROUTINE W REFLEX MICROSCOPIC
Bilirubin Urine: NEGATIVE
Glucose, UA: NEGATIVE mg/dL
Ketones, ur: NEGATIVE mg/dL
Leukocytes,Ua: NEGATIVE
Nitrite: NEGATIVE
Protein, ur: NEGATIVE mg/dL
Specific Gravity, Urine: 1.015 (ref 1.005–1.030)
pH: 6 (ref 5.0–8.0)

## 2021-03-20 LAB — RESP PANEL BY RT-PCR (FLU A&B, COVID) ARPGX2
Influenza A by PCR: NEGATIVE
Influenza B by PCR: NEGATIVE
SARS Coronavirus 2 by RT PCR: NEGATIVE

## 2021-03-20 LAB — URINALYSIS, MICROSCOPIC (REFLEX): WBC, UA: NONE SEEN WBC/hpf (ref 0–5)

## 2021-03-20 MED ORDER — ACETAMINOPHEN 325 MG PO TABS
650.0000 mg | ORAL_TABLET | Freq: Once | ORAL | Status: AC
  Administered 2021-03-20: 650 mg via ORAL
  Filled 2021-03-20: qty 2

## 2021-03-20 NOTE — ED Triage Notes (Addendum)
Pt c/o chills started ~1030am-denies known fever/denies flu sx-states she felt her heart racing and felt like she couldn't walk after a nap ~1 hour PTA-NAD-steady gait-pt added she is wearing a heart monitor  x 8 days due to cardiac hx and new cardiologist

## 2021-03-20 NOTE — ED Provider Notes (Signed)
[facility] EMERGENCY DEPARTMENT Provider Note   CSN: [id number] Arrival date & time: 03/20/21  1727     History  Chief Complaint  Patient presents with   Chills    Natalie Bell is a 61 y.o. female.  Patient presents with complaint of "hard chills", onset around 1200 today. No documented fever at home. Patient febrile upon arrival in ED. Mild headache and nasal congestion. Recently exposed to young family member with URI symptoms. Patient denies ear pain, sore throat, chest pain, cough, shortness of breath, abdominal pain, vomiting or diarrhea. Mild nausea. Patient is wearing a loop recorder for evaluation of tachycardia episodes, likely a-fib with RVR. Patient is not tachycardic at present.   Fever Onset quality:  Sudden Duration:  7 hours Progression:  Waxing and waning Chronicity:  New Associated symptoms: chills, headaches, myalgias and nausea   Associated symptoms: no chest pain, no cough, no diarrhea, no dysuria, no ear pain, no sore throat and no vomiting   Risk factors: sick contacts       Home Medications Prior to Admission medications   Medication Sig Start Date End Date Taking? Authorizing Provider  Ascorbic Acid (VITAMIN C) 100 MG tablet Take 100 mg by mouth daily.    [provider]  diltiazem (CARDIZEM) 120 MG tablet Take 120 mg by mouth 4 (four) times daily.    [provider]  ergocalciferol (VITAMIN D2) 1.25 MG (50000 UT) capsule Take 50,000 Units by mouth once a week.    [provider]  levothyroxine (SYNTHROID) 88 MCG tablet Take 88 mcg by mouth daily before breakfast.    [provider]  Magnesium Oxide 400 MG CAPS Take by mouth daily.    [provider]  Misc Natural Products (AIRBORNE ELDERBERRY PO) Take 100 mg by mouth daily.    [provider]  potassium bicarbonate (K-LYTE) 25 MEQ disintegrating tablet Take 25 mEq by mouth 2 (two) times daily.    [provider]  valsartan  (DIOVAN) 80 MG tablet Take 80 mg by mouth daily.    [provider]      Allergies    Patient has no known allergies.    Review of Systems   Review of Systems  Constitutional:  Positive for chills and fever.  HENT:  Negative for ear pain and sore throat.   Respiratory:  Negative for cough.   Cardiovascular:  Negative for chest pain.  Gastrointestinal:  Positive for nausea. Negative for diarrhea and vomiting.  Genitourinary:  Negative for dysuria.  Musculoskeletal:  Positive for myalgias.  Neurological:  Positive for headaches.  All other systems reviewed and are negative.  Physical Exam Updated Vital Signs BP 125/69 (BP Location: Right Arm)    Pulse 90    Temp (!) 101.4 F (38.6 C) (Oral)    Resp 18    Ht 5\' 9"  (1.753 m)    Wt 104.3 kg    LMP  (LMP Unknown)    SpO2 100%    BMI 33.97 kg/m  Physical Exam Vitals and nursing note reviewed.  Constitutional:      Appearance: Normal appearance.  HENT:     Head: Normocephalic.     Right Ear: Tympanic membrane normal.     Left Ear: Tympanic membrane normal.     Nose: Nose normal.     Mouth/Throat:     Mouth: Mucous membranes are moist.  Eyes:     Conjunctiva/sclera: Conjunctivae normal.     Pupils: Pupils are equal,  round, and reactive to light.  Cardiovascular:     Rate and Rhythm: Normal rate.  Pulmonary:     Effort: Pulmonary effort is normal.     Breath sounds: Normal breath sounds.  Abdominal:     Palpations: Abdomen is soft.  Musculoskeletal:        General: Normal range of motion.     Cervical back: Normal range of motion and neck supple.  Skin:    General: Skin is warm and dry.  Neurological:     Mental Status: She is alert and oriented to person, place, and time.  Psychiatric:        Mood and Affect: Mood normal.        Behavior: Behavior normal.    ED Results / Procedures / Treatments   Labs (all labs ordered are listed, but only abnormal results are displayed) Labs Reviewed  RESP PANEL BY RT-PCR  (FLU A&B, COVID) ARPGX2  URINALYSIS, ROUTINE W REFLEX MICROSCOPIC    EKG None  Radiology No results found.  Procedures Procedures    Medications Ordered in ED Medications  acetaminophen (TYLENOL) tablet 650 mg (650 mg Oral Given 03/20/21 1741)    ED Course/ Medical Decision Making/ A&P                              Medical Decision Making Amount and/or Complexity of Data Reviewed Labs: ordered.  Risk OTC drugs.   Pt symptoms consistent with URI. Respiratory panel negative for COVID and influenza. Fever improved after antipyresis. Pt will be discharged with symptomatic treatment.  Discussed return precautions.  Pt is hemodynamically stable & in NAD prior to discharge. Patient appears safe for discharge at this time.        Final Clinical Impression(s) / ED Diagnoses Final diagnoses:  Viral syndrome    Rx / DC Orders ED Discharge Orders     None         Felicie Morn, NP 03/21/21 0009    Gilda Crease, MD 03/22/21 [phone]

## 2021-03-20 NOTE — Discharge Instructions (Addendum)
Your symptoms are likely due to upper respiratory virus. Please refer to the attached instuctions. Drink plenty of fluids and treat fever/discomfort with tylenol and/or ibuprofen. Follow-up with your PCP if no improvement over the next few days.

## 2021-04-12 ENCOUNTER — Telehealth: Payer: Self-pay | Admitting: Cardiology

## 2021-04-12 NOTE — Telephone Encounter (Signed)
Patient returned call for monitor results.  

## 2021-04-12 NOTE — Telephone Encounter (Signed)
The patient has been notified of the result and verbalized understanding.  All questions (if any) were answered. ?Sampson Goon, RN 04/12/2021 2:42 PM   ?

## 2021-04-29 ENCOUNTER — Telehealth: Payer: Self-pay | Admitting: Cardiology

## 2021-04-29 NOTE — Telephone Encounter (Signed)
See my chart message

## 2021-04-29 NOTE — Telephone Encounter (Signed)
Patient was returning phone call to Dr. Quentin Ore or nurse. Please call back ?

## 2021-04-29 NOTE — Telephone Encounter (Signed)
Pt sent this message via mychart to the scheduling pool:   ? ?A authorization was suppose to be gotten for a Loop recorder for my upcoming appointment. Was the authorization approved for the Loop recorder? Just wanted to be prepared for my upcoming appointment as far as what will be done. Thanks ?

## 2021-04-29 NOTE — Telephone Encounter (Signed)
Ciro Backer ? ?I called the patient's insurance today for prior auth. Her insurance is a bit unique and she will have to call her insurance company to purchase coverage for the ILR (cpt code: [phone]) after she does that, I will be able to obtain prior authorization. Once obtained, it will be covered 100% after she pays her $70. Co-pay. If she doesn't want to purchase the code, the ILR will be non-covered. -AH  ?

## 2021-05-07 NOTE — Progress Notes (Signed)
?Electrophysiology Office Follow up Visit Note:   ? ?Date:  05/08/2021  ? ?ID:  Natalie Bell, DOB 10/30/1960, MRN [id number] ? ?PCP:  Clearnce HastenMartin, Maida H, PA-C  ? ?[facility] Electrophysiologist:  Lanier PrudeAMERON T Aziel Morgan, MD  ? ? ?Interval History:   ? ?Natalie Bell is a 61 y.o. female who presents for a follow up visit. I last saw the patient 03/08/2021 for SVT as a second opinion. She previously was diagnosed with atrial tachycardia and an EP study was recommended. PPM was also discussed in the past with the patient. I started with a zio to evaluate her arrhythmias but this was unrevealing. We previously discussed using a loop recorder for ongoing arrhythmia surveillance. She presents today to follow up and for possible implant of a loop. ? ? ? ?  ? ?Past Medical History:  ?Diagnosis Date  ? Dyspnea   ? Heart murmur   ? HTN (hypertension)   ? Hypothyroidism (acquired)   ? IFG (impaired fasting glucose)   ? Obesity   ? Pain in both lower extremities   ? Paroxysmal atrial tachycardia (HCC)   ? Pre-syncope   ? Sinus bradycardia   ? SVT (supraventricular tachycardia) (HCC)   ? Vitamin D deficiency   ? ? ?No past surgical history on file. ? ?Current Medications: ?Current Meds  ?Medication Sig  ? Ascorbic Acid (VITAMIN C) 100 MG tablet Take 100 mg by mouth daily.  ? diltiazem (CARDIZEM) 120 MG tablet Take 120 mg by mouth 4 (four) times daily.  ? ergocalciferol (VITAMIN D2) 1.25 MG (50000 UT) capsule Take 50,000 Units by mouth once a week.  ? levothyroxine (SYNTHROID) 88 MCG tablet Take 88 mcg by mouth daily before breakfast.  ? Magnesium Oxide 400 MG CAPS Take by mouth daily.  ? Misc Natural Products (AIRBORNE ELDERBERRY PO) Take 100 mg by mouth daily.  ? potassium bicarbonate (K-LYTE) 25 MEQ disintegrating tablet Take 25 mEq by mouth 2 (two) times daily.  ? valsartan (DIOVAN) 80 MG tablet Take 80 mg by mouth daily.  ?  ? ?Allergies:   Patient has no known allergies.  ? ?Social History  ? ?Socioeconomic History  ?  Marital status: Single  ?  Spouse name: Not on file  ? Number of children: Not on file  ? Years of education: Not on file  ? Highest education level: Not on file  ?Occupational History  ? Not on file  ?Tobacco Use  ? Smoking status: Never  ? Smokeless tobacco: Never  ? Tobacco comments:  ?  Never smoked  ?Vaping Use  ? Vaping Use: Never used  ?Substance and Sexual Activity  ? Alcohol use: Not Currently  ? Drug use: Never  ? Sexual activity: Not on file  ?  Comment: Hysterectomy 2004  ?Other Topics Concern  ? Not on file  ?Social History Narrative  ? Not on file  ? ?Social Determinants of Health  ? ?Financial Resource Strain: Not on file  ?Food Insecurity: Not on file  ?Transportation Needs: Not on file  ?Physical Activity: Not on file  ?Stress: Not on file  ?Social Connections: Not on file  ?  ? ?Family History: ?The patient's family history includes Asthma in her maternal grandmother; Hypertension in her maternal grandmother and mother. ? ?ROS:   ?Please see the history of present illness.    ?All other systems reviewed and are negative. ? ?EKGs/Labs/Other Studies Reviewed:   ? ?The following studies were reviewed today: ? ?04/02/2021 Zio personally reviewed ?  HR 45 - 182 bpm, average 69 bpm. ?21 SVT, longest lasting 11 beats. Review of rhythm strip suggests atrial tachycardia. ?Rare supraventricular and ventricular ectopy. ?No sustained arrhythmias. ?Symptom triggered episodes correspond to sinus rhythm +/- PAC/PVCs. ?  ? ?EKG:  The ekg ordered today demonstrates sinus rhythm ? ?Recent Labs: ?No results found for requested labs within last 8760 hours.  ?Recent Lipid Panel ?No results found for: CHOL, TRIG, HDL, CHOLHDL, VLDL, LDLCALC, LDLDIRECT ? ?Physical Exam:   ? ?VS:  BP 120/86   Pulse (!) 58   Ht 5\' 9"  (1.753 m)   Wt 233 lb 9.6 oz (106 kg)   LMP  (LMP Unknown)   SpO2 98%   BMI 34.50 kg/m?    ? ?Wt Readings from Last 3 Encounters:  ?05/08/21 233 lb 9.6 oz (106 kg)  ?03/20/21 230 lb (104.3 kg)  ?03/08/21  235 lb 12.8 oz (107 kg)  ?  ? ?GEN:  Well nourished, well developed in no acute distress ?HEENT: Normal ?NECK: No JVD; No carotid bruits ?LYMPHATICS: No lymphadenopathy ?CARDIAC: RRR, no murmurs, rubs, gallops ?RESPIRATORY:  Clear to auscultation without rales, wheezing or rhonchi  ?ABDOMEN: Soft, non-tender, non-distended ?MUSCULOSKELETAL:  No edema; No deformity  ?SKIN: Warm and dry ?NEUROLOGIC:  Alert and oriented x 3 ?PSYCHIATRIC:  Normal affect  ? ? ? ?  ? ?ASSESSMENT:   ? ?1. SVT (supraventricular tachycardia) (HCC)   ?2. Atrial tachycardia (HCC)   ? ?PLAN:   ? ?In order of problems listed above: ? ?#SVT ?Unclear etiology. Recent Zio monitor without identifiable cause of her symptoms. I discussed using a loop recorder for ongoing heart rhythm surveillance. I discussed alternative strategies for heart rhythm monitoring including apple/galaxy watch or a Kardia mobile device. She would like to pursue a loop recorder implant today. ? ?Total time spent with patient today 40 minutes. This includes reviewing records, evaluating the patient and coordinating care.  ? ?Medication Adjustments/Labs and Tests Ordered: ?Current medicines are reviewed at length with the patient today.  Concerns regarding medicines are outlined above.  ?Orders Placed This Encounter  ?Procedures  ? EKG 12-Lead  ? ?No orders of the defined types were placed in this encounter. ? ? ? ?Signed, ?03/10/21, MD, [facility], [facility] ?05/08/2021 9:15 PM    ?Electrophysiology ?[facility] ? ? ?---------------------------------- ? ?SURGEON:  05/10/2021, MD ?   ?PREPROCEDURE DIAGNOSIS:  SVT, palpitations ?   ?POSTPROCEDURE DIAGNOSIS:  SVT, palpitations ?   ? PROCEDURES:  ? 1. Implantable loop recorder implantation ?   ?INTRODUCTION:  Natalie Bell is a 61 y.o. patient with a history of SVT and palpitations. She has worn a heart rhythm monitor without identifying any clear cause. The patient therefore presents today for  implantable loop implantation. The costs of loop recorder monitoring have been discussed with the patient. ?   ?DESCRIPTION OF PROCEDURE:  Informed written consent was obtained.  The patient required no sedation for the procedure today.  Mapping over the patient's chest was performed to identify the area where electrograms were most prominent for ILR recording.  This area was found to be the left parasternal region over the 4th intercostal space. ?The patients left chest was therefore prepped and draped in the usual sterile fashion. The skin overlying the left parasternal region was infiltrated with lidocaine for local analgesia.  A 0.5-cm incision was made over the left parasternal region over the 3rd intercostal space.  A subcutaneous ILR pocket was fashioned using a  combination of sharp and blunt dissection.  A Medtronic Reveal Linq model [id number]) implantable loop recorder was then placed into the pocket  R waves were very prominent and measured >0.27mV.  Steri- Strips and a sterile dressing were then applied.  There were no early apparent complications.  ?   ?CONCLUSIONS:  ? 1. Successful implantation of a Medtronic Reveal LINQ implantable loop recorder for a history of SVT ? 2. No early apparent complications.  ? ?Steffanie Dunn, MD ?04/09/2020 ?3:51 PM ? ? ?

## 2021-05-08 ENCOUNTER — Ambulatory Visit (INDEPENDENT_AMBULATORY_CARE_PROVIDER_SITE_OTHER): Payer: No Typology Code available for payment source | Admitting: Cardiology

## 2021-05-08 ENCOUNTER — Encounter: Payer: Self-pay | Admitting: Cardiology

## 2021-05-08 VITALS — BP 120/86 | HR 58 | Ht 69.0 in | Wt 233.6 lb

## 2021-05-08 DIAGNOSIS — I471 Supraventricular tachycardia: Secondary | ICD-10-CM | POA: Diagnosis not present

## 2021-05-08 NOTE — Patient Instructions (Signed)
Medication Instructions:  ?Your physician recommends that you continue on your current medications as directed. Please refer to the Current Medication list given to you today. ? ?Labwork: ?None ordered. ? ?Testing/Procedures: ?None ordered. ? ?Follow-Up: ? ?Your physician wants you to follow-up in: 6 months with Dr. Lalla Brothers.  You will receive a reminder letter in the mail two months in advance. If you don't receive a letter, please call our office to schedule the follow-up appointment. ? ? ? ?Implantable Loop Recorder Placement, Care After ?This sheet gives you information about how to care for yourself after your procedure. Your health care provider may also give you more specific instructions. If you have problems or questions, contact your health care provider. ?What can I expect after the procedure? ?After the procedure, it is common to have: ?Soreness or discomfort near the incision. ?Some swelling or bruising near the incision. ? ?Follow these instructions at home: ?Incision care ? ? Leave your outer dressing on for 72 hours.  After 72 hours you can remove your outer dressing and shower. ?Leave adhesive strips in place. These skin closures may need to stay in place for 1-2 weeks. If adhesive strip edges start to loosen and curl up, you may trim the loose edges.  You may remove the strips if they have not fallen off after 2 weeks. ?Check your incision area every day for signs of infection. Check for: ?Redness, swelling, or pain. ?Fluid or blood. ?Warmth. ?Pus or a bad smell. ?Do not take baths, swim, or use a hot tub until your incision is completely healed. ?If your wound site starts to bleed apply pressure.   ?   ?If you have any questions/concerns please call the device clinic at [phone]. ? ?Activity ? ?Return to your normal activities. ? ?General instructions ?Follow instructions from your health care provider about how to manage your implantable loop recorder and transmit the information. Learn how to  activate a recording if this is necessary for your type of device. ?You may go through a metal detection gate, and you may let someone hold a metal detector over your chest. Show your ID card if needed. ?Do not have an MRI unless you check with your health care provider first. ?Take over-the-counter and prescription medicines only as told by your health care provider. ?Keep all follow-up visits as told by your health care provider. This is important. ?Contact a health care provider if: ?You have redness, swelling, or pain around your incision. ?You have a fever. ?You have pain that is not relieved by your pain medicine. ?You have triggered your device because of fainting (syncope) or because of a heartbeat that feels like it is racing, slow, fluttering, or skipping (palpitations). ?Get help right away if you have: ?Chest pain. ?Difficulty breathing. ?Summary ?After the procedure, it is common to have soreness or discomfort near the incision. ?Change your dressing as told by your health care provider. ?Follow instructions from your health care provider about how to manage your implantable loop recorder and transmit the information. ?Keep all follow-up visits as told by your health care provider. This is important. ?This information is not intended to replace advice given to you by your health care provider. Make sure you discuss any questions you have with your health care provider. ?Document Released: 01/08/2015 Document Revised: 03/14/2017 Document Reviewed: 03/14/2017 ?Elsevier Patient Education ? 2020 Elsevier Inc. ? ? ? ? ? ?  ? ? ?

## 2021-06-10 ENCOUNTER — Ambulatory Visit (INDEPENDENT_AMBULATORY_CARE_PROVIDER_SITE_OTHER): Payer: No Typology Code available for payment source

## 2021-06-10 DIAGNOSIS — I471 Supraventricular tachycardia: Secondary | ICD-10-CM | POA: Diagnosis not present

## 2021-06-11 LAB — CUP PACEART REMOTE DEVICE CHECK
Date Time Interrogation Session: 20230501155939
Implantable Pulse Generator Implant Date: 20230329

## 2021-06-25 NOTE — Progress Notes (Signed)
Carelink Summary Report / Loop Recorder 

## 2021-07-15 ENCOUNTER — Ambulatory Visit (INDEPENDENT_AMBULATORY_CARE_PROVIDER_SITE_OTHER): Payer: No Typology Code available for payment source

## 2021-07-15 DIAGNOSIS — I4719 Other supraventricular tachycardia: Secondary | ICD-10-CM

## 2021-07-15 DIAGNOSIS — I471 Supraventricular tachycardia: Secondary | ICD-10-CM | POA: Diagnosis not present

## 2021-07-15 LAB — CUP PACEART REMOTE DEVICE CHECK
Date Time Interrogation Session: 20230603160139
Implantable Pulse Generator Implant Date: 20230329

## 2021-07-22 ENCOUNTER — Encounter: Payer: Self-pay | Admitting: Cardiology

## 2021-07-23 NOTE — Telephone Encounter (Signed)
Spoke with patient, patient was attempting to read actual PDF report of LINQ, advised patient to focus on the interpretation by Dr. Lalla Brothers patient voiced understanding patient is concerned about her her BP and having headaches patient feels like this started since she was taken off Cardizem, patient has been keeping a recording of her BPs last one 07/22/21 afternoon with reading of 143/93 Informed patient I would route this back to Dr. Geannie Risen nurse Inetta Fermo in order to discuss with Dr. Lalla Brothers patient voiced understanding

## 2021-08-01 NOTE — Progress Notes (Signed)
Carelink Summary Report / Loop Recorder 

## 2021-08-18 LAB — CUP PACEART REMOTE DEVICE CHECK
Date Time Interrogation Session: 20230706155832
Implantable Pulse Generator Implant Date: 20230329

## 2021-08-19 ENCOUNTER — Ambulatory Visit (INDEPENDENT_AMBULATORY_CARE_PROVIDER_SITE_OTHER): Payer: No Typology Code available for payment source

## 2021-08-19 DIAGNOSIS — I471 Supraventricular tachycardia: Secondary | ICD-10-CM | POA: Diagnosis not present

## 2021-09-16 NOTE — Progress Notes (Signed)
Carelink Summary Report / Loop Recorder 

## 2021-09-19 LAB — CUP PACEART REMOTE DEVICE CHECK
Date Time Interrogation Session: 20230808160216
Implantable Pulse Generator Implant Date: 20230329

## 2021-09-23 ENCOUNTER — Ambulatory Visit: Payer: No Typology Code available for payment source

## 2021-10-26 LAB — CUP PACEART REMOTE DEVICE CHECK
Date Time Interrogation Session: 20230910155804
Implantable Pulse Generator Implant Date: 20230329

## 2021-10-28 ENCOUNTER — Ambulatory Visit (INDEPENDENT_AMBULATORY_CARE_PROVIDER_SITE_OTHER): Payer: No Typology Code available for payment source

## 2021-10-28 DIAGNOSIS — I4719 Other supraventricular tachycardia: Secondary | ICD-10-CM

## 2021-10-28 DIAGNOSIS — I471 Supraventricular tachycardia: Secondary | ICD-10-CM

## 2021-11-11 NOTE — Progress Notes (Signed)
Carelink Summary Report / Loop Recorder 

## 2021-11-13 ENCOUNTER — Encounter: Payer: No Typology Code available for payment source | Admitting: Cardiology

## 2021-11-13 NOTE — Progress Notes (Deleted)
Electrophysiology Office Follow up Visit Note:    Date:  11/13/2021   ID:  Natalie Bell, DOB [birth year], MRN [id number]  PCP:  Debroah Loop, PA-C  [facility] Cardiologist:  None  [facility] Electrophysiologist:  Vickie Epley, MD    Interval History:    Natalie Bell is a 61 y.o. female who presents for a follow up visit. They were last seen in clinic 05/08/2021 for SVT. In the past she wore holter monitors which were unrevealing. She had a loop recorder implanted at our last appointment for ongoing monitoring of her heart rhythm given her continued symptoms.  Since loop implant her symptom triggered episodes correspond to sinus rhythm. No AF or sustained arrhythmias have been detected.      Past Medical History:  Diagnosis Date   Dyspnea    Heart murmur    HTN (hypertension)    Hypothyroidism (acquired)    IFG (impaired fasting glucose)    Obesity    Pain in both lower extremities    Paroxysmal atrial tachycardia (HCC)    Pre-syncope    Sinus bradycardia    SVT (supraventricular tachycardia) (HCC)    Vitamin D deficiency     No past surgical history on file.  Current Medications: No outpatient medications have been marked as taking for the 11/13/21 encounter (Appointment) with Vickie Epley, MD.     Allergies:   Patient has no known allergies.   Social History   Socioeconomic History   Marital status: Single    Spouse name: Not on file   Number of children: Not on file   Years of education: Not on file   Highest education level: Not on file  Occupational History   Not on file  Tobacco Use   Smoking status: Never   Smokeless tobacco: Never   Tobacco comments:    Never smoked  Vaping Use   Vaping Use: Never used  Substance and Sexual Activity   Alcohol use: Not Currently   Drug use: Never   Sexual activity: Not on file    Comment: Hysterectomy 2004  Other Topics Concern   Not on file  Social History Narrative   Not on file    Social Determinants of Health   Financial Resource Strain: Not on file  Food Insecurity: Not on file  Transportation Needs: Not on file  Physical Activity: Not on file  Stress: Not on file  Social Connections: Not on file     Family History: The patient's family history includes Asthma in her maternal grandmother; Hypertension in her maternal grandmother and mother.  ROS:   Please see the history of present illness.    All other systems reviewed and are negative.  EKGs/Labs/Other Studies Reviewed:    The following studies were reviewed today:   EKG:  The ekg ordered today demonstrates ***  Recent Labs: No results found for requested labs within last 365 days.  Recent Lipid Panel No results found for: "CHOL", "TRIG", "HDL", "CHOLHDL", "VLDL", "LDLCALC", "LDLDIRECT"  Physical Exam:    VS:  LMP  (LMP Unknown)     Wt Readings from Last 3 Encounters:  05/08/21 233 lb 9.6 oz (106 kg)  03/20/21 230 lb (104.3 kg)  03/08/21 235 lb 12.8 oz (107 kg)     GEN: *** Well nourished, well developed in no acute distress HEENT: Normal NECK: No JVD; No carotid bruits LYMPHATICS: No lymphadenopathy CARDIAC: ***RRR, no murmurs, rubs, gallops RESPIRATORY:  Clear to auscultation without rales,  wheezing or rhonchi  ABDOMEN: Soft, non-tender, non-distended MUSCULOSKELETAL:  No edema; No deformity  SKIN: Warm and dry NEUROLOGIC:  Alert and oriented x 3 PSYCHIATRIC:  Normal affect        ASSESSMENT:    No diagnosis found. PLAN:    In order of problems listed above:           Total time spent with patient today *** minutes. This includes reviewing records, evaluating the patient and coordinating care.   Medication Adjustments/Labs and Tests Ordered: Current medicines are reviewed at length with the patient today.  Concerns regarding medicines are outlined above.  No orders of the defined types were placed in this encounter.  No orders of the defined types were  placed in this encounter.    Signed, Lars Mage, MD, [facility], [facility] 11/13/2021 7:19 AM    Electrophysiology [facility]

## 2021-11-27 LAB — CUP PACEART REMOTE DEVICE CHECK
Date Time Interrogation Session: 20231013155704
Implantable Pulse Generator Implant Date: 20230329

## 2021-12-02 ENCOUNTER — Ambulatory Visit (INDEPENDENT_AMBULATORY_CARE_PROVIDER_SITE_OTHER): Payer: No Typology Code available for payment source

## 2021-12-02 DIAGNOSIS — I471 Supraventricular tachycardia, unspecified: Secondary | ICD-10-CM

## 2021-12-30 NOTE — Progress Notes (Signed)
Carelink Summary Report / Loop Recorder 

## 2022-01-06 ENCOUNTER — Ambulatory Visit (INDEPENDENT_AMBULATORY_CARE_PROVIDER_SITE_OTHER): Payer: No Typology Code available for payment source

## 2022-01-06 DIAGNOSIS — I471 Supraventricular tachycardia, unspecified: Secondary | ICD-10-CM | POA: Diagnosis not present

## 2022-01-07 LAB — CUP PACEART REMOTE DEVICE CHECK
Date Time Interrogation Session: 20231126231126
Implantable Pulse Generator Implant Date: 20230329

## 2022-01-07 NOTE — Progress Notes (Unsigned)
Electrophysiology Office Follow up Visit Note:    Date:  01/08/2022   ID:  Natalie Bell, DOB July 25, 1960, MRN [id number]  PCP:  Clearnce Hasten, PA-C   [facility] Electrophysiologist:  Lanier Prude, MD    Interval History:    Natalie Bell is a 61 y.o. female who presents for a follow up visit. I last saw the patient 03/08/2021 for SVT as a second opinion. She previously was diagnosed with atrial tachycardia and an EP study was recommended. PPM was also discussed in the past with the patient. I started with a zio to evaluate her arrhythmias but this was unrevealing. At the last appt, we implanted a loop recorder.  Since our last appointment, she has done well without sustained palpitations or arrhythmias.       Past Medical History:  Diagnosis Date   Dyspnea    Heart murmur    HTN (hypertension)    Hypothyroidism (acquired)    IFG (impaired fasting glucose)    Obesity    Pain in both lower extremities    Paroxysmal atrial tachycardia    Pre-syncope    Sinus bradycardia    SVT (supraventricular tachycardia)    Vitamin D deficiency     History reviewed. No pertinent surgical history.  Current Medications: Current Meds  Medication Sig   Ascorbic Acid (VITAMIN C) 100 MG tablet Take 100 mg by mouth daily.   ergocalciferol (VITAMIN D2) 1.25 MG (50000 UT) capsule Take 50,000 Units by mouth once a week.   levothyroxine (SYNTHROID) 88 MCG tablet Take 88 mcg by mouth daily before breakfast.   Magnesium Oxide 400 MG CAPS Take by mouth daily.   Misc Natural Products (AIRBORNE ELDERBERRY PO) Take 100 mg by mouth daily.   potassium bicarbonate (K-LYTE) 25 MEQ disintegrating tablet Take 25 mEq by mouth 2 (two) times daily.   valsartan (DIOVAN) 80 MG tablet Take 80 mg by mouth daily.     Allergies:   Patient has no known allergies.   Social History   Socioeconomic History   Marital status: Single    Spouse name: Not on file   Number of children: Not on file    Years of education: Not on file   Highest education level: Not on file  Occupational History   Not on file  Tobacco Use   Smoking status: Never   Smokeless tobacco: Never   Tobacco comments:    Never smoked  Vaping Use   Vaping Use: Never used  Substance and Sexual Activity   Alcohol use: Not Currently   Drug use: Never   Sexual activity: Not on file    Comment: Hysterectomy 2004  Other Topics Concern   Not on file  Social History Narrative   Not on file   Social Determinants of Health   Financial Resource Strain: Not on file  Food Insecurity: Not on file  Transportation Needs: Not on file  Physical Activity: Not on file  Stress: Not on file  Social Connections: Not on file     Family History: The patient's family history includes Asthma in her maternal grandmother; Hypertension in her maternal grandmother and mother.  ROS:   Please see the history of present illness.    All other systems reviewed and are negative.  EKGs/Labs/Other Studies Reviewed:    The following studies were reviewed today:    Recent Labs: No results found for requested labs within last 365 days.  Recent Lipid Panel No results found for: "CHOL", "  TRIG", "HDL", "CHOLHDL", "VLDL", "LDLCALC", "LDLDIRECT"  Physical Exam:    VS:  BP 138/88   Pulse 70   Ht 5\' 9"  (1.753 m)   Wt 226 lb (102.5 kg)   LMP  (LMP Unknown)   SpO2 98%   BMI 33.37 kg/m     Wt Readings from Last 3 Encounters:  01/08/22 226 lb (102.5 kg)  05/08/21 233 lb 9.6 oz (106 kg)  03/20/21 230 lb (104.3 kg)     GEN:  Well nourished, well developed in no acute distress HEENT: Normal NECK: No JVD; No carotid bruits LYMPHATICS: No lymphadenopathy CARDIAC: RRR, no murmurs, rubs, gallops RESPIRATORY:  Clear to auscultation without rales, wheezing or rhonchi  ABDOMEN: Soft, non-tender, non-distended MUSCULOSKELETAL:  No edema; No deformity  SKIN: Warm and dry NEUROLOGIC:  Alert and oriented x 3 PSYCHIATRIC:  Normal  affect        ASSESSMENT:    1. SVT (supraventricular tachycardia)   2. Atrial tachycardia   3. Primary hypertension    PLAN:    In order of problems listed above:  #SVT No sustained recurrence. Continue remote monitoring of loop recorder.  #Hypertension At goal today.  Recommend checking blood pressures 1-2 times per week at home and recording the values.  Recommend bringing these recordings to the primary care physician.  Follow up 1 year.  Total time spent with patient today 40 minutes. This includes reviewing records, evaluating the patient and coordinating care.   Medication Adjustments/Labs and Tests Ordered: Current medicines are reviewed at length with the patient today.  Concerns regarding medicines are outlined above.  No orders of the defined types were placed in this encounter.  No orders of the defined types were placed in this encounter.    Signed, 05/18/21, MD, [facility], [facility] 01/08/2022 11:23 PM    Electrophysiology [facility]

## 2022-01-08 ENCOUNTER — Ambulatory Visit: Payer: No Typology Code available for payment source | Attending: Cardiology | Admitting: Cardiology

## 2022-01-08 ENCOUNTER — Encounter: Payer: Self-pay | Admitting: Cardiology

## 2022-01-08 VITALS — BP 138/88 | HR 70 | Ht 69.0 in | Wt 226.0 lb

## 2022-01-08 DIAGNOSIS — I4719 Other supraventricular tachycardia: Secondary | ICD-10-CM | POA: Diagnosis not present

## 2022-01-08 DIAGNOSIS — I471 Supraventricular tachycardia, unspecified: Secondary | ICD-10-CM

## 2022-01-08 DIAGNOSIS — I10 Essential (primary) hypertension: Secondary | ICD-10-CM | POA: Diagnosis not present

## 2022-01-08 NOTE — Patient Instructions (Signed)
Medication Instructions:  Your physician recommends that you continue on your current medications as directed. Please refer to the Current Medication list given to you today.  *If you need a refill on your cardiac medications before your next appointment, please call your pharmacy*  Follow-Up: At [facility], you and your health needs are our priority.  As part of our continuing mission to provide you with exceptional heart care, we have created designated Provider Care Teams.  These Care Teams include your primary Cardiologist (physician) and Advanced Practice Providers (APPs -  Physician Assistants and Nurse Practitioners) who all work together to provide you with the care you need, when you need it.  Your next appointment:   1 year(s)  The format for your next appointment:   In Person  Provider:   You will see one of the following Advanced Practice Providers on your designated Care Team:   Renee Ursuy, PA-C Michael "Andy" Tillery, PA-C   Important Information About Sugar       

## 2022-02-11 ENCOUNTER — Ambulatory Visit (INDEPENDENT_AMBULATORY_CARE_PROVIDER_SITE_OTHER): Payer: No Typology Code available for payment source

## 2022-02-11 DIAGNOSIS — I471 Supraventricular tachycardia, unspecified: Secondary | ICD-10-CM

## 2022-02-11 LAB — CUP PACEART REMOTE DEVICE CHECK
Date Time Interrogation Session: 20240101230846
Implantable Pulse Generator Implant Date: 20230329

## 2022-02-17 NOTE — Progress Notes (Signed)
Carelink Summary Report / Loop Recorder 

## 2022-03-14 NOTE — Progress Notes (Signed)
Carelink Summary Report / Loop Recorder 

## 2022-03-16 LAB — CUP PACEART REMOTE DEVICE CHECK
Date Time Interrogation Session: 20240203230814
Implantable Pulse Generator Implant Date: 20230329

## 2022-03-17 ENCOUNTER — Ambulatory Visit: Payer: No Typology Code available for payment source

## 2022-03-17 DIAGNOSIS — I4719 Other supraventricular tachycardia: Secondary | ICD-10-CM

## 2022-04-21 ENCOUNTER — Ambulatory Visit (INDEPENDENT_AMBULATORY_CARE_PROVIDER_SITE_OTHER): Payer: No Typology Code available for payment source

## 2022-04-21 DIAGNOSIS — I4719 Other supraventricular tachycardia: Secondary | ICD-10-CM | POA: Diagnosis not present

## 2022-04-23 LAB — CUP PACEART REMOTE DEVICE CHECK
Date Time Interrogation Session: 20240310230731
Implantable Pulse Generator Implant Date: 20230329

## 2022-05-01 NOTE — Progress Notes (Signed)
Carelink Summary Report / Loop Recorder 

## 2022-05-26 ENCOUNTER — Ambulatory Visit (INDEPENDENT_AMBULATORY_CARE_PROVIDER_SITE_OTHER): Payer: No Typology Code available for payment source

## 2022-05-26 DIAGNOSIS — I4719 Other supraventricular tachycardia: Secondary | ICD-10-CM | POA: Diagnosis not present

## 2022-05-27 LAB — CUP PACEART REMOTE DEVICE CHECK
Date Time Interrogation Session: 20240414230502
Implantable Pulse Generator Implant Date: 20230329

## 2022-05-29 NOTE — Progress Notes (Signed)
Carelink Summary Report / Loop Recorder 

## 2022-06-30 ENCOUNTER — Ambulatory Visit (INDEPENDENT_AMBULATORY_CARE_PROVIDER_SITE_OTHER): Payer: No Typology Code available for payment source

## 2022-06-30 DIAGNOSIS — I4719 Other supraventricular tachycardia: Secondary | ICD-10-CM | POA: Diagnosis not present

## 2022-06-30 LAB — CUP PACEART REMOTE DEVICE CHECK
Date Time Interrogation Session: 20240517230304
Implantable Pulse Generator Implant Date: 20230329

## 2022-06-30 NOTE — Progress Notes (Signed)
Carelink Summary Report / Loop Recorder 

## 2022-07-28 NOTE — Progress Notes (Signed)
Carelink Summary Report / Loop Recorder 

## 2022-08-04 ENCOUNTER — Ambulatory Visit: Payer: No Typology Code available for payment source

## 2022-08-04 DIAGNOSIS — I4719 Other supraventricular tachycardia: Secondary | ICD-10-CM

## 2022-08-04 LAB — CUP PACEART REMOTE DEVICE CHECK
Date Time Interrogation Session: 20240623230644
Implantable Pulse Generator Implant Date: 20230329

## 2022-08-22 NOTE — Progress Notes (Signed)
Carelink Summary Report / Loop Recorder 

## 2022-09-08 ENCOUNTER — Ambulatory Visit (INDEPENDENT_AMBULATORY_CARE_PROVIDER_SITE_OTHER): Payer: No Typology Code available for payment source

## 2022-09-08 DIAGNOSIS — I4719 Other supraventricular tachycardia: Secondary | ICD-10-CM

## 2022-09-24 NOTE — Progress Notes (Signed)
Carelink Summary Report / Loop Recorder 

## 2022-10-08 ENCOUNTER — Ambulatory Visit (INDEPENDENT_AMBULATORY_CARE_PROVIDER_SITE_OTHER): Payer: No Typology Code available for payment source

## 2022-10-08 DIAGNOSIS — I4719 Other supraventricular tachycardia: Secondary | ICD-10-CM

## 2022-10-09 LAB — CUP PACEART REMOTE DEVICE CHECK
Date Time Interrogation Session: 20240828230644
Implantable Pulse Generator Implant Date: 20230329

## 2022-10-17 NOTE — Progress Notes (Signed)
Carelink Summary Report / Loop Recorder 

## 2022-11-10 ENCOUNTER — Ambulatory Visit (INDEPENDENT_AMBULATORY_CARE_PROVIDER_SITE_OTHER): Payer: No Typology Code available for payment source

## 2022-11-10 DIAGNOSIS — I4719 Other supraventricular tachycardia: Secondary | ICD-10-CM | POA: Diagnosis not present

## 2022-11-11 LAB — CUP PACEART REMOTE DEVICE CHECK
Date Time Interrogation Session: 20240930231801
Implantable Pulse Generator Implant Date: 20230329

## 2022-11-24 NOTE — Progress Notes (Signed)
Carelink Summary Report / Loop Recorder 

## 2022-12-15 ENCOUNTER — Ambulatory Visit (INDEPENDENT_AMBULATORY_CARE_PROVIDER_SITE_OTHER): Payer: No Typology Code available for payment source

## 2022-12-15 DIAGNOSIS — I4719 Other supraventricular tachycardia: Secondary | ICD-10-CM | POA: Diagnosis not present

## 2022-12-15 LAB — CUP PACEART REMOTE DEVICE CHECK
Date Time Interrogation Session: 20241102230435
Implantable Pulse Generator Implant Date: 20230329

## 2023-01-06 NOTE — Progress Notes (Signed)
Carelink Summary Report / Loop Recorder 

## 2023-01-19 ENCOUNTER — Ambulatory Visit (INDEPENDENT_AMBULATORY_CARE_PROVIDER_SITE_OTHER): Payer: No Typology Code available for payment source

## 2023-01-19 DIAGNOSIS — I4719 Other supraventricular tachycardia: Secondary | ICD-10-CM | POA: Diagnosis not present

## 2023-01-19 LAB — CUP PACEART REMOTE DEVICE CHECK
Date Time Interrogation Session: 20241208231012
Implantable Pulse Generator Implant Date: 20230329

## 2023-01-23 ENCOUNTER — Ambulatory Visit: Payer: No Typology Code available for payment source | Attending: Student | Admitting: Student

## 2023-01-23 ENCOUNTER — Encounter: Payer: Self-pay | Admitting: Student

## 2023-01-23 VITALS — BP 132/80 | Ht 69.0 in | Wt 233.6 lb

## 2023-01-23 DIAGNOSIS — I10 Essential (primary) hypertension: Secondary | ICD-10-CM | POA: Diagnosis not present

## 2023-01-23 DIAGNOSIS — I471 Supraventricular tachycardia, unspecified: Secondary | ICD-10-CM | POA: Diagnosis not present

## 2023-01-23 DIAGNOSIS — I4719 Other supraventricular tachycardia: Secondary | ICD-10-CM

## 2023-01-23 NOTE — Progress Notes (Signed)
   Electrophysiology Office Note:   Date:  01/23/2023  ID:  Natalie Bell, DOB 1960/05/08, MRN [id number]  Primary Cardiologist: None Electrophysiologist: Lanier Prude, MD      History of Present Illness:   Natalie Bell is a 62 y.o. female with h/o SVT, HTN, and AT seen today for routine electrophysiology followup.   Since last being seen in our clinic the patient reports doing well. No further SVT or breakthrough. Overall,  she denies chest pain, palpitations, dyspnea, PND, orthopnea, nausea, vomiting, dizziness, syncope, edema, weight gain, or early satiety.   Review of systems complete and found to be negative unless listed in HPI.   Device History: Medtronic loop recorder implanted 04/2021 for palpitations/SVT  Studies Reviewed:    EKG is not ordered today. Review of ILR showed no episodes and NSR.   Physical Exam:   VS:  BP 132/80   Ht 5\' 9"  (1.753 m)   Wt 233 lb 9.6 oz (106 kg)   LMP  (LMP Unknown)   SpO2 98%   BMI 34.50 kg/m    Wt Readings from Last 3 Encounters:  01/23/23 233 lb 9.6 oz (106 kg)  01/08/22 226 lb (102.5 kg)  05/08/21 233 lb 9.6 oz (106 kg)     GEN: Well nourished, well developed in no acute distress NECK: No JVD; No carotid bruits CARDIAC: Regular rate and rhythm, no murmurs, rubs, gallops RESPIRATORY:  Clear to auscultation without rales, wheezing or rhonchi  ABDOMEN: Soft, non-tender, non-distended EXTREMITIES:  No edema; No deformity   ILR Interrogation- reviewed in detail today,  See PACEART report  ASSESSMENT AND PLAN:    Palpitations/SVT s/p Medtronic Loop recorder Normal device function See Pace Art report No changes today Tachy detection is up in the 170s. She also uses her apple watch. Can adjust detection zones if needed.   If she has sustained or identified symptoms can consider EPS, but ILR has thus far been unrevealing.   HTN Stable on current regimen   Follow up with Dr. Lalla Brothers in 12 months  Signed, Graciella Freer, PA-C

## 2023-01-23 NOTE — Patient Instructions (Signed)

## 2023-02-23 ENCOUNTER — Ambulatory Visit (INDEPENDENT_AMBULATORY_CARE_PROVIDER_SITE_OTHER): Payer: No Typology Code available for payment source

## 2023-02-23 DIAGNOSIS — I4719 Other supraventricular tachycardia: Secondary | ICD-10-CM | POA: Diagnosis not present

## 2023-02-23 LAB — CUP PACEART REMOTE DEVICE CHECK
Date Time Interrogation Session: 20250112231503
Implantable Pulse Generator Implant Date: 20230329

## 2023-02-26 NOTE — Progress Notes (Signed)
Carelink Summary Report / Loop Recorder 

## 2023-03-30 ENCOUNTER — Ambulatory Visit (INDEPENDENT_AMBULATORY_CARE_PROVIDER_SITE_OTHER): Payer: No Typology Code available for payment source

## 2023-03-30 DIAGNOSIS — I4719 Other supraventricular tachycardia: Secondary | ICD-10-CM | POA: Diagnosis not present

## 2023-03-31 LAB — CUP PACEART REMOTE DEVICE CHECK
Date Time Interrogation Session: 20250216230957
Implantable Pulse Generator Implant Date: 20230329

## 2023-04-01 ENCOUNTER — Encounter: Payer: Self-pay | Admitting: Cardiology

## 2023-04-07 NOTE — Progress Notes (Signed)
 Carelink Summary Report / Loop Recorder

## 2023-05-04 ENCOUNTER — Ambulatory Visit (INDEPENDENT_AMBULATORY_CARE_PROVIDER_SITE_OTHER): Payer: No Typology Code available for payment source

## 2023-05-04 DIAGNOSIS — I4719 Other supraventricular tachycardia: Secondary | ICD-10-CM

## 2023-05-04 LAB — CUP PACEART REMOTE DEVICE CHECK
Date Time Interrogation Session: 20250323231030
Implantable Pulse Generator Implant Date: 20230329

## 2023-05-06 NOTE — Addendum Note (Signed)
 Addended by: Geralyn Flash D on: 05/06/2023 03:58 PM   Modules accepted: Orders

## 2023-05-06 NOTE — Progress Notes (Signed)
 Carelink Summary Report / Loop Recorder

## 2023-05-09 ENCOUNTER — Encounter: Payer: Self-pay | Admitting: Cardiology

## 2023-06-08 ENCOUNTER — Ambulatory Visit (INDEPENDENT_AMBULATORY_CARE_PROVIDER_SITE_OTHER): Payer: No Typology Code available for payment source

## 2023-06-08 ENCOUNTER — Encounter: Payer: Self-pay | Admitting: Cardiology

## 2023-06-08 DIAGNOSIS — I4719 Other supraventricular tachycardia: Secondary | ICD-10-CM

## 2023-06-08 LAB — CUP PACEART REMOTE DEVICE CHECK
Date Time Interrogation Session: 20250427231314
Implantable Pulse Generator Implant Date: 20230329

## 2023-06-22 NOTE — Progress Notes (Signed)
 Carelink Summary Report / Loop Recorder

## 2023-07-13 ENCOUNTER — Ambulatory Visit: Payer: Self-pay | Admitting: Cardiology

## 2023-07-13 ENCOUNTER — Ambulatory Visit (INDEPENDENT_AMBULATORY_CARE_PROVIDER_SITE_OTHER)

## 2023-07-13 DIAGNOSIS — I4719 Other supraventricular tachycardia: Secondary | ICD-10-CM

## 2023-07-13 LAB — CUP PACEART REMOTE DEVICE CHECK
Date Time Interrogation Session: 20250601232755
Implantable Pulse Generator Implant Date: 20230329

## 2023-07-28 NOTE — Progress Notes (Signed)
 Carelink Summary Report / Loop Recorder

## 2023-08-13 ENCOUNTER — Ambulatory Visit

## 2023-08-13 DIAGNOSIS — I471 Supraventricular tachycardia, unspecified: Secondary | ICD-10-CM | POA: Diagnosis not present

## 2023-08-13 LAB — CUP PACEART REMOTE DEVICE CHECK
Date Time Interrogation Session: 20250702232146
Implantable Pulse Generator Implant Date: 20230329

## 2023-08-22 ENCOUNTER — Ambulatory Visit: Payer: Self-pay | Admitting: Cardiology

## 2023-09-01 NOTE — Progress Notes (Signed)
 Carelink Summary Report / Loop Recorder

## 2023-09-14 ENCOUNTER — Ambulatory Visit: Payer: Self-pay | Admitting: Cardiology

## 2023-09-14 ENCOUNTER — Ambulatory Visit

## 2023-09-14 DIAGNOSIS — I4719 Other supraventricular tachycardia: Secondary | ICD-10-CM | POA: Diagnosis not present

## 2023-09-14 LAB — CUP PACEART REMOTE DEVICE CHECK
Date Time Interrogation Session: 20250802231214
Implantable Pulse Generator Implant Date: 20230329

## 2023-10-15 ENCOUNTER — Ambulatory Visit

## 2023-10-15 DIAGNOSIS — I471 Supraventricular tachycardia, unspecified: Secondary | ICD-10-CM

## 2023-10-15 LAB — CUP PACEART REMOTE DEVICE CHECK
Date Time Interrogation Session: 20250903232207
Implantable Pulse Generator Implant Date: 20230329

## 2023-10-17 ENCOUNTER — Ambulatory Visit: Payer: Self-pay | Admitting: Cardiology

## 2023-10-26 NOTE — Progress Notes (Signed)
 Remote Loop Recorder Transmission

## 2023-11-09 NOTE — Progress Notes (Signed)
 Remote Loop Recorder Transmission

## 2023-11-12 NOTE — Progress Notes (Signed)
 Remote Loop Recorder Transmission

## 2023-11-16 ENCOUNTER — Ambulatory Visit

## 2023-11-16 ENCOUNTER — Encounter

## 2023-11-16 DIAGNOSIS — I471 Supraventricular tachycardia, unspecified: Secondary | ICD-10-CM

## 2023-11-16 LAB — CUP PACEART REMOTE DEVICE CHECK
Date Time Interrogation Session: 20251005231759
Implantable Pulse Generator Implant Date: 20230329

## 2023-11-17 NOTE — Progress Notes (Signed)
 Remote Loop Recorder Transmission

## 2023-11-18 ENCOUNTER — Ambulatory Visit: Payer: Self-pay | Admitting: Cardiology

## 2023-12-17 ENCOUNTER — Encounter

## 2023-12-17 ENCOUNTER — Ambulatory Visit (INDEPENDENT_AMBULATORY_CARE_PROVIDER_SITE_OTHER)

## 2023-12-17 DIAGNOSIS — I4719 Other supraventricular tachycardia: Secondary | ICD-10-CM | POA: Diagnosis not present

## 2023-12-17 LAB — CUP PACEART REMOTE DEVICE CHECK
Date Time Interrogation Session: 20251105232252
Implantable Pulse Generator Implant Date: 20230329

## 2023-12-18 NOTE — Progress Notes (Signed)
 Remote Loop Recorder Transmission

## 2023-12-21 ENCOUNTER — Ambulatory Visit: Payer: Self-pay | Admitting: Cardiology

## 2024-01-17 ENCOUNTER — Ambulatory Visit

## 2024-01-18 ENCOUNTER — Encounter

## 2024-01-19 LAB — CUP PACEART REMOTE DEVICE CHECK
Date Time Interrogation Session: 20251206231001
Implantable Pulse Generator Implant Date: 20230329

## 2024-01-20 ENCOUNTER — Ambulatory Visit: Payer: Self-pay | Admitting: Cardiology

## 2024-01-22 ENCOUNTER — Encounter: Payer: Self-pay | Admitting: Emergency Medicine

## 2024-01-26 NOTE — Progress Notes (Signed)
 Remote Loop Recorder Transmission

## 2024-02-17 ENCOUNTER — Ambulatory Visit

## 2024-02-17 DIAGNOSIS — I471 Supraventricular tachycardia, unspecified: Secondary | ICD-10-CM

## 2024-02-18 ENCOUNTER — Ambulatory Visit: Payer: Self-pay | Admitting: Cardiology

## 2024-02-18 ENCOUNTER — Encounter

## 2024-02-18 LAB — CUP PACEART REMOTE DEVICE CHECK
Date Time Interrogation Session: 20260106231410
Implantable Pulse Generator Implant Date: 20230329

## 2024-02-22 NOTE — Progress Notes (Signed)
 Remote Loop Recorder Transmission

## 2024-03-18 ENCOUNTER — Ambulatory Visit: Admitting: Pulmonary Disease

## 2024-03-18 ENCOUNTER — Encounter: Payer: Self-pay | Admitting: Pulmonary Disease

## 2024-03-18 VITALS — BP 136/86 | HR 70 | Ht 69.0 in | Wt 238.0 lb

## 2024-03-18 DIAGNOSIS — I10 Essential (primary) hypertension: Secondary | ICD-10-CM

## 2024-03-18 DIAGNOSIS — I471 Supraventricular tachycardia, unspecified: Secondary | ICD-10-CM

## 2024-03-18 NOTE — Patient Instructions (Signed)
 Medication Instructions:  Your physician recommends that you continue on your current medications as directed. Please refer to the Current Medication list given to you today.  *If you need a refill on your cardiac medications before your next appointment, please call your pharmacy*  Lab Work: None ordered If you have labs (blood work) drawn today and your tests are completely normal, you will receive your results only by: MyChart Message (if you have MyChart) OR A paper copy in the mail If you have any lab test that is abnormal or we need to change your treatment, we will call you to review the results.  Follow-Up: At [facility], you and your health needs are our priority.  As part of our continuing mission to provide you with exceptional heart care, our providers are all part of one team.  This team includes your primary Cardiologist (physician) and Advanced Practice Providers or APPs (Physician Assistants and Nurse Practitioners) who all work together to provide you with the care you need, when you need it.  Your next appointment:   1 year(s)  Provider:   Daphne Barrack, NP

## 2024-03-18 NOTE — Progress Notes (Addendum)
" °  Electrophysiology Office Note:   Date:  03/18/2024  ID:  Natalie Bell, DOB 04/13/1960, MRN [id number]  Primary Cardiologist: None Primary Heart Failure: None Electrophysiologist: OLE ONEIDA HOLTS, MD (Inactive)      History of Present Illness:   Natalie Bell is a 64 y.o. female with h/o SVT, AT, HTN seen today for routine electrophysiology followup.   Since last being seen in our clinic the patient reports doing well overall. She has not had an episode in the last 2 months or so.  When she has an episode, she gets short of breath out of the blue and it can be at rest. The episodes are brief.   She denies chest pain with episodes. No change in activity tolerance. She denies chest pain, PND, orthopnea, nausea, vomiting, dizziness, syncope, edema, weight gain, or early satiety.   Review of systems complete and found to be negative unless listed in HPI.   EP Information / Studies Reviewed:    EKG is ordered today. Personal review as below.  EKG Interpretation Date/Time:  Friday March 18 2024 15:37:17 EST Ventricular Rate:  70 PR Interval:  146 QRS Duration:  68 QT Interval:  370 QTC Calculation: 399 R Axis:   25  Text Interpretation: Normal sinus rhythm with sinus arrhythmia Confirmed by Bell Natalie ([id number]) on 03/18/2024 4:02:57 PM   Device History:  MDT ILR implanted 04/2021 for palpitations / SVT    Physical Exam:   VS:  BP 136/86   Pulse 70   Ht 5' 9 (1.753 m)   Wt 238 lb (108 kg)   LMP  (LMP Unknown)   SpO2 96%   BMI 35.15 kg/m    Wt Readings from Last 3 Encounters:  03/18/24 238 lb (108 kg)  01/23/23 233 lb 9.6 oz (106 kg)  01/08/22 226 lb (102.5 kg)     GEN: Well nourished, well developed in no acute distress NECK: No JVD; No carotid bruits CARDIAC: Regular rate and rhythm, no murmurs, rubs, gallops RESPIRATORY:  Clear to auscultation without rales, wheezing or rhonchi  ABDOMEN: Soft, non-tender, non-distended EXTREMITIES:  No edema; No deformity    Risk Assessment/Calculations:       ASSESSMENT AND PLAN:    Palpitations / SVT  MDT ILR  -ILR review shows NSR as presenting, battery status good, PVC burden 0.1%, no AT/AF. 1 pause episode listed but on EGM review it is a change in gain / NSR / no pause.   -pt largely asymptomatic  -discussed hitting symptom button with her episodes of shortness of breath to correlate with HR  -negative stress test at [facility]   -not on agents > if symptomatic, consider PRN agent / BB or CCB vs EPS if profound sx  Hypertension  -well controlled on current regimen     Follow up with EP APP in 12 months or sooner if new needs arise  Signed, Natalie Aniceto, NP-C, AGACNP-BC [facility] - Electrophysiology  03/18/2024, 4:03 PM  "

## 2024-03-19 ENCOUNTER — Encounter

## 2024-03-21 ENCOUNTER — Encounter

## 2024-04-19 ENCOUNTER — Ambulatory Visit

## 2024-05-20 ENCOUNTER — Ambulatory Visit

## 2024-06-20 ENCOUNTER — Ambulatory Visit

## 2024-07-21 ENCOUNTER — Ambulatory Visit

## 2024-08-21 ENCOUNTER — Ambulatory Visit

## 2024-09-21 ENCOUNTER — Ambulatory Visit

## 2024-10-22 ENCOUNTER — Ambulatory Visit

## 2024-11-22 ENCOUNTER — Ambulatory Visit

## 2024-12-23 ENCOUNTER — Ambulatory Visit

## 2025-01-23 ENCOUNTER — Ambulatory Visit

## 2025-02-23 ENCOUNTER — Ambulatory Visit
